# Patient Record
Sex: Female | Born: 1955 | Race: White | Hispanic: No | Marital: Married | Smoking: Never smoker
Health system: Southern US, Community
[De-identification: ages and names within clinical notes are randomized; demographics above are authoritative.]

## PROBLEM LIST (undated history)

## (undated) DIAGNOSIS — Z973 Presence of spectacles and contact lenses: Secondary | ICD-10-CM

## (undated) DIAGNOSIS — E079 Disorder of thyroid, unspecified: Secondary | ICD-10-CM

## (undated) DIAGNOSIS — M545 Low back pain, unspecified: Secondary | ICD-10-CM

## (undated) DIAGNOSIS — E559 Vitamin D deficiency, unspecified: Secondary | ICD-10-CM

## (undated) DIAGNOSIS — T753XXA Motion sickness, initial encounter: Secondary | ICD-10-CM

## (undated) DIAGNOSIS — IMO0002 Reserved for concepts with insufficient information to code with codable children: Secondary | ICD-10-CM

## (undated) DIAGNOSIS — E039 Hypothyroidism, unspecified: Secondary | ICD-10-CM

## (undated) DIAGNOSIS — Z972 Presence of dental prosthetic device (complete) (partial): Secondary | ICD-10-CM

## (undated) DIAGNOSIS — M1990 Unspecified osteoarthritis, unspecified site: Secondary | ICD-10-CM

## (undated) DIAGNOSIS — M5481 Occipital neuralgia: Secondary | ICD-10-CM

## (undated) HISTORY — DX: Low back pain: M54.5

## (undated) HISTORY — DX: Reserved for concepts with insufficient information to code with codable children: IMO0002

## (undated) HISTORY — DX: Low back pain, unspecified: M54.50

## (undated) HISTORY — PX: GALLBLADDER SURGERY: SHX652

## (undated) HISTORY — DX: Unspecified osteoarthritis, unspecified site: M19.90

## (undated) HISTORY — DX: Vitamin D deficiency, unspecified: E55.9

## (undated) HISTORY — DX: Disorder of thyroid, unspecified: E07.9

## (undated) HISTORY — PX: OTHER SURGICAL HISTORY: SHX169

---

## 2004-11-10 ENCOUNTER — Ambulatory Visit: Payer: Self-pay | Admitting: Orthopedic Surgery

## 2005-06-13 ENCOUNTER — Ambulatory Visit (HOSPITAL_COMMUNITY): Admission: RE | Admit: 2005-06-13 | Discharge: 2005-06-13 | Payer: Self-pay | Admitting: Obstetrics & Gynecology

## 2006-03-28 ENCOUNTER — Ambulatory Visit (HOSPITAL_COMMUNITY): Admission: RE | Admit: 2006-03-28 | Discharge: 2006-03-28 | Payer: Self-pay | Admitting: Pulmonary Disease

## 2006-05-14 ENCOUNTER — Ambulatory Visit: Payer: Self-pay | Admitting: Orthopedic Surgery

## 2006-06-25 ENCOUNTER — Ambulatory Visit (HOSPITAL_COMMUNITY): Admission: RE | Admit: 2006-06-25 | Discharge: 2006-06-25 | Payer: Self-pay | Admitting: Obstetrics & Gynecology

## 2006-07-04 ENCOUNTER — Ambulatory Visit: Payer: Self-pay | Admitting: Orthopedic Surgery

## 2006-08-03 ENCOUNTER — Ambulatory Visit: Payer: Self-pay | Admitting: Gastroenterology

## 2006-08-13 ENCOUNTER — Ambulatory Visit: Payer: Self-pay | Admitting: Internal Medicine

## 2006-08-13 ENCOUNTER — Ambulatory Visit (HOSPITAL_COMMUNITY): Admission: RE | Admit: 2006-08-13 | Discharge: 2006-08-13 | Payer: Self-pay | Admitting: Internal Medicine

## 2006-11-13 ENCOUNTER — Ambulatory Visit (HOSPITAL_COMMUNITY): Admission: RE | Admit: 2006-11-13 | Discharge: 2006-11-13 | Payer: Self-pay | Admitting: Pulmonary Disease

## 2007-06-17 ENCOUNTER — Other Ambulatory Visit: Admission: RE | Admit: 2007-06-17 | Discharge: 2007-06-17 | Payer: Self-pay | Admitting: Obstetrics & Gynecology

## 2007-08-05 ENCOUNTER — Ambulatory Visit (HOSPITAL_COMMUNITY): Admission: RE | Admit: 2007-08-05 | Discharge: 2007-08-05 | Payer: Self-pay | Admitting: Obstetrics & Gynecology

## 2008-01-20 ENCOUNTER — Ambulatory Visit (HOSPITAL_COMMUNITY): Admission: RE | Admit: 2008-01-20 | Discharge: 2008-01-20 | Payer: Self-pay | Admitting: Pulmonary Disease

## 2008-06-11 ENCOUNTER — Ambulatory Visit: Payer: Self-pay | Admitting: Orthopedic Surgery

## 2008-06-11 DIAGNOSIS — M25569 Pain in unspecified knee: Secondary | ICD-10-CM | POA: Insufficient documentation

## 2008-06-11 DIAGNOSIS — M1710 Unilateral primary osteoarthritis, unspecified knee: Secondary | ICD-10-CM

## 2008-06-11 DIAGNOSIS — S53106A Unspecified dislocation of unspecified ulnohumeral joint, initial encounter: Secondary | ICD-10-CM | POA: Insufficient documentation

## 2008-06-23 ENCOUNTER — Other Ambulatory Visit: Admission: RE | Admit: 2008-06-23 | Discharge: 2008-06-23 | Payer: Self-pay | Admitting: Obstetrics & Gynecology

## 2008-07-28 ENCOUNTER — Telehealth: Payer: Self-pay | Admitting: Orthopedic Surgery

## 2008-08-10 ENCOUNTER — Ambulatory Visit (HOSPITAL_COMMUNITY): Admission: RE | Admit: 2008-08-10 | Discharge: 2008-08-10 | Payer: Self-pay | Admitting: Obstetrics & Gynecology

## 2008-10-02 ENCOUNTER — Telehealth: Payer: Self-pay | Admitting: Orthopedic Surgery

## 2009-02-08 ENCOUNTER — Ambulatory Visit: Payer: Self-pay | Admitting: Orthopedic Surgery

## 2009-05-17 ENCOUNTER — Encounter (INDEPENDENT_AMBULATORY_CARE_PROVIDER_SITE_OTHER): Payer: Self-pay

## 2009-05-17 LAB — CONVERTED CEMR LAB
BUN: 13 mg/dL
Calcium: 9.1 mg/dL
Chloride: 105 meq/L
Glucose, Bld: 92 mg/dL
HDL: 51 mg/dL
LDL Cholesterol: 130 mg/dL
Triglycerides: 150 mg/dL

## 2009-06-09 ENCOUNTER — Ambulatory Visit (HOSPITAL_COMMUNITY): Admission: RE | Admit: 2009-06-09 | Discharge: 2009-06-09 | Payer: Self-pay | Admitting: Pulmonary Disease

## 2009-06-14 ENCOUNTER — Ambulatory Visit: Payer: Self-pay | Admitting: Cardiology

## 2009-06-22 ENCOUNTER — Encounter: Payer: Self-pay | Admitting: Adult Health

## 2009-06-22 ENCOUNTER — Encounter (INDEPENDENT_AMBULATORY_CARE_PROVIDER_SITE_OTHER): Payer: Self-pay

## 2009-06-22 ENCOUNTER — Ambulatory Visit: Payer: Self-pay | Admitting: Cardiology

## 2009-06-22 DIAGNOSIS — R0602 Shortness of breath: Secondary | ICD-10-CM | POA: Insufficient documentation

## 2009-06-22 DIAGNOSIS — R079 Chest pain, unspecified: Secondary | ICD-10-CM | POA: Insufficient documentation

## 2009-08-11 ENCOUNTER — Other Ambulatory Visit: Admission: RE | Admit: 2009-08-11 | Discharge: 2009-08-11 | Payer: Self-pay | Admitting: Obstetrics & Gynecology

## 2009-09-06 ENCOUNTER — Ambulatory Visit (HOSPITAL_COMMUNITY): Admission: RE | Admit: 2009-09-06 | Discharge: 2009-09-06 | Payer: Self-pay | Admitting: Obstetrics & Gynecology

## 2009-11-22 ENCOUNTER — Ambulatory Visit: Payer: Self-pay | Admitting: Orthopedic Surgery

## 2010-05-02 ENCOUNTER — Telehealth: Payer: Self-pay | Admitting: Orthopedic Surgery

## 2010-07-25 ENCOUNTER — Encounter: Payer: Self-pay | Admitting: Orthopedic Surgery

## 2010-08-02 ENCOUNTER — Telehealth: Payer: Self-pay | Admitting: Orthopedic Surgery

## 2010-08-31 ENCOUNTER — Encounter: Payer: Self-pay | Admitting: Orthopedic Surgery

## 2010-09-01 ENCOUNTER — Ambulatory Visit
Admission: RE | Admit: 2010-09-01 | Discharge: 2010-09-01 | Payer: Self-pay | Source: Home / Self Care | Attending: Orthopedic Surgery | Admitting: Orthopedic Surgery

## 2010-09-01 DIAGNOSIS — M25519 Pain in unspecified shoulder: Secondary | ICD-10-CM | POA: Insufficient documentation

## 2010-09-01 DIAGNOSIS — G5690 Unspecified mononeuropathy of unspecified upper limb: Secondary | ICD-10-CM | POA: Insufficient documentation

## 2010-09-04 ENCOUNTER — Encounter: Payer: Self-pay | Admitting: Pulmonary Disease

## 2010-09-07 ENCOUNTER — Encounter (HOSPITAL_COMMUNITY)
Admission: RE | Admit: 2010-09-07 | Discharge: 2010-09-13 | Payer: Self-pay | Source: Home / Self Care | Attending: Orthopedic Surgery | Admitting: Orthopedic Surgery

## 2010-09-08 ENCOUNTER — Encounter: Payer: Self-pay | Admitting: Orthopedic Surgery

## 2010-09-13 NOTE — Progress Notes (Signed)
Summary: call about 6 m appt reminder  Phone Note Call from Patient   Caller: Patient Summary of Call: Patient rec'd 6 m reminder for appointment due in Oct 2011, response to med. Asking if "really needs to come" due to work/school schedule, difficulty getting time off.  States nothing has changed.  Need to come for refill Rx? Please advise - ph above ([phone]) Initial call taken by: Cammie Sickle,  May 02, 2010 12:29 PM  Follow-up for Phone Call        ok to cancel appt   let us know if any changes  Follow-up by: Fuller Canada MD,  May 02, 2010 12:37 PM  Additional Follow-up for Phone Call Additional follow up Details #1::        called patient; left voice mail message. Additional Follow-up by: Cammie Sickle,  May 03, 2010 11:28 AM

## 2010-09-13 NOTE — Assessment & Plan Note (Signed)
Summary: FOR MED REFILL/ST BCBS/BSF   Visit Type:  Follow-up Primary Provider:  [name]  CC:  KNEE PAIN .  History of Present Illness: 55 year old female we are following for osteoarthritis of the knees.  She is taking diclofenac 50 mg b.i.d. and is doing very well.  She only has intermittent symptoms at this time.  She says she is 98% better.  Her activity level is good.  She's having no major symptoms from the 50 mg b.i.d. tablets and is also helping her disc problem in her cervical spine   06/11/08 last xrays bilateral knees, will gert new xrays today.  OA bilateral knees.  x-rays were taken today compared to previous films about 6 months ago there's been no major change in her x-rays  Recommend continue diclofenac 50 twice a day follow up 6 months no x-rays needed unless symptoms change      Allergies: No Known Drug Allergies   Impression & Recommendations:  Problem # 1:  KNEE, ARTHRITIS, DEGEN./OSTEO (ICD-715.96) Assessment Improved  Her updated medication list for this problem includes:    Diclofenac Sodium 50 Mg Tbec (Diclofenac sodium) ..... One by mouth bid  Orders: Est. Patient Level III (27253) Knee x-ray,  3 views (73562)bilateral 3 views  Mild joint space narrowing both knees no change vs. x-rays taken 6 months ago  Impression mild osteoarthritis  Medications Added to Medication List This Visit: 1)  Vitamin D 50,000  .... Take 1 time weekly  Patient Instructions: 1)  continue diclofenac 50 two times a day see  you in 6 months  Prescriptions: DICLOFENAC SODIUM 50 MG TBEC (DICLOFENAC SODIUM) one by mouth bid  #30 x 3   Entered and Authorized by:   Fuller Canada MD   Signed by:   Fuller Canada MD on 11/22/2009   Method used:   Faxed to ...       [facility]  [address] [facility] (retail)       [address]       [place], [place]  [address]       Ph: [phone]       Fax: [phone]   RxID:   [id number] DICLOFENAC  SODIUM 50 MG TBEC (DICLOFENAC SODIUM) one by mouth bid  #30 x 3   Entered and Authorized by:   Fuller Canada MD   Signed by:   Fuller Canada MD on 11/22/2009   Method used:   Historical   RxID:   [id number]

## 2010-09-14 ENCOUNTER — Other Ambulatory Visit: Payer: Self-pay | Admitting: Obstetrics & Gynecology

## 2010-09-14 ENCOUNTER — Ambulatory Visit (HOSPITAL_COMMUNITY)
Admission: RE | Admit: 2010-09-14 | Discharge: 2010-09-14 | Disposition: A | Discharge status: Home / Self Care | Payer: BC Managed Care – HMO | Source: Ambulatory Visit | Attending: Orthopedic Surgery | Admitting: Orthopedic Surgery

## 2010-09-14 DIAGNOSIS — Z139 Encounter for screening, unspecified: Secondary | ICD-10-CM

## 2010-09-14 DIAGNOSIS — IMO0001 Reserved for inherently not codable concepts without codable children: Secondary | ICD-10-CM | POA: Insufficient documentation

## 2010-09-14 DIAGNOSIS — M25619 Stiffness of unspecified shoulder, not elsewhere classified: Secondary | ICD-10-CM | POA: Insufficient documentation

## 2010-09-14 DIAGNOSIS — M25519 Pain in unspecified shoulder: Secondary | ICD-10-CM | POA: Insufficient documentation

## 2010-09-14 DIAGNOSIS — M6281 Muscle weakness (generalized): Secondary | ICD-10-CM | POA: Insufficient documentation

## 2010-09-15 NOTE — Letter (Signed)
Summary: History form  History form   Imported By: Jacklynn Ganong 09/06/2010 14:03:30  _____________________________________________________________________  External Attachment:    Type:   Image     Comment:   External Document

## 2010-09-15 NOTE — Letter (Signed)
Summary: Notes from [facility]  Notes from [facility]   Imported By: Jacklynn Ganong 09/09/2010 12:29:06  _____________________________________________________________________  External Attachment:    Type:   Image     Comment:   External Document

## 2010-09-15 NOTE — Assessment & Plan Note (Signed)
Summary: NEW PROB/LT SHOULDER PAIN/NEEDS XRAY/REC'D MRI CSPINE/OK'D PE...   Vital Signs:  Patient profile:   55 year old female Height:      70 inches Pulse rate:   80 / minute Resp:     16 per minute  Vitals Entered By: Fuller Canada MD (September 01, 2010 8:50 AM)  Visit Type:  new problem Referring Provider:  self Primary Provider:  [name]  CC:  left shoulder pain.  History of Present Illness: I saw Kelsey Contreras in the office today for a new problem visit.  She is a 55 years old woman with the complaint of:  left shoulder pain.  No injury.  Xrays today.  Meds: Levothyroxine, Diclofenac, Fiber, Vitamin D.  Note patient has been followed by a pain management specialist and also is followed by Dr. Jeral Fruit for her issues with her cervical spine   This patient presents with pain along the medial border of her scapula in the posterior glenohumeral joint line with no associated trauma.  Today she says she's not hurting him badly.  She also has cervical disc disease at C5-C6, C6-C7.  She is being followed at the pain clinic for occipital headaches.  She comes in for evaluation of the shoulder to see if these are related.  she basically complains of dull stabbing pain 3/10 primarily in the late afternoon and at bedtime though sometimes all day.  She does have some numbness and tingling in the arm which is most likely related to her cervical disc disease.  She has difficulty sleeping or lying on her LEFT side and she does note some weakness in the LEFT upper extremity.     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 Gastrointestinal:  Complains of constipation; denies heartburn, nausea, vomiting, diarrhea, and blood in your stools. Genitourinary:  Complains of frequency; denies urgency, difficulty urinating, painful urination, flank pain, and bleeding in urine. Neurologic:  Complains of numbness; denies tingling, unsteady gait, dizziness, tremors, and seizure. Musculoskeletal:  Complains of joint pain and stiffness; denies swelling, instability, redness, heat, and muscle pain. HEENT:  Complains of watering; denies blurred or double vision, eye pain, and redness. Immunology:  Complains of seasonal allergies; denies sinus problems and allergic to bee stings.  The review of systems is negative for Constitutional, Respiratory, Endocrine, Psychiatric, Skin, and Hemoatologic.  Physical Exam  Additional Exam:   General appearance is normal.  Orientation x3 normal mood and affect. Normal.  Gait and station are normal.  She demonstrates fairly good range of motion in the cervical spine.  LEFT medial border of the scapula is tender and painful especially at the superior angle. He demonstrates full range of motion. Shoulder is stable in abduction external rotation. Neer sign. Normal Hawkins sign. Normal bringing the arm across the chest causes no pain.  strength is 5 over 5 of the supraspinatus.  Skin is intact. Pulses are good. Temperatures normal. No palpable lymph nodes are present.  The coordination and sensation were normal  The reflexes were normal     Impression & Recommendations:  Problem # 1:  SHOULDER PAIN (ICD-719.41) Assessment New  Separate  and Identifiable X-Ray report      AP lateral LEFT shoulder/shoulder pain  Radiographs show normal glenohumeral joint.  Type II acromion.  No acromial spur.  Normal bony anatomy.  Impression normal shoulder.  Orders: Est. Patient  Level IV (16606) Shoulder x-ray,  minimum 2 views (30160)  Problem # 2:  UNSPECIFIED MONONEURITIS OF UPPER LIMB (ICD-354.9)  may have a trigger point on the medial border of the scapular may be some radicular pain from a thoracic upper disc but basically appears to be myofascial  Recommend physical therapy home program follow as needed  Orders: Est. Patient Level IV (10932)   Orders Added: 1)  Est. Patient Level IV [35573] 2)  Shoulder x-ray,  minimum 2 views [73030]

## 2010-09-15 NOTE — Letter (Signed)
Summary: Office note from Dr. Loraine Leriche Phillips/[facility]  Office note from Dr. Loraine Leriche Phillips/[facility]   Imported By: Jacklynn Ganong 07/25/2010 16:07:20  _____________________________________________________________________  External Attachment:    Type:   Image     Comment:   External Document

## 2010-09-15 NOTE — Progress Notes (Signed)
Summary: patient requested notes, film for review here  Phone Note Call from Patient   Caller: Patient Summary of Call: Patient called to re-check on status --  She is request'g appointment here for Problem: shoulder pain/neck pain.  An office note she had req'd to be faxed to our office from [facility], Dr Thyra Breed has been rec'd.  (Her ph is [phone]) She had also obtained copy of her MRI C-spine film from [facility] - it is here.  All placed in Dr's box for review Advised Dr Romeo Apple will look over all and we will notify her of his review and whether we would schedule here or other recommendation. Initial call taken by: Cammie Sickle,  August 02, 2010 11:24 AM  Follow-up for Phone Call        On 08/03/10 at 2:50pm I called patient per Dr Mort Sawyers written note - see for shoulder only (for neck/C-spine-it would be Dr. Jeral Fruit) -scheduled appointment for shoulder for 09/01/10. Note and MRI film here. Follow-up by: Cammie Sickle,  August 04, 2010 9:17 AM

## 2010-09-21 ENCOUNTER — Ambulatory Visit (HOSPITAL_COMMUNITY)
Admission: RE | Admit: 2010-09-21 | Discharge: 2010-09-21 | Disposition: A | Discharge status: Home / Self Care | Payer: BC Managed Care – HMO | Source: Ambulatory Visit | Attending: Orthopedic Surgery | Admitting: Orthopedic Surgery

## 2010-09-21 DIAGNOSIS — IMO0001 Reserved for inherently not codable concepts without codable children: Secondary | ICD-10-CM | POA: Insufficient documentation

## 2010-09-21 DIAGNOSIS — M25519 Pain in unspecified shoulder: Secondary | ICD-10-CM | POA: Insufficient documentation

## 2010-09-21 DIAGNOSIS — M6281 Muscle weakness (generalized): Secondary | ICD-10-CM | POA: Insufficient documentation

## 2010-09-28 ENCOUNTER — Ambulatory Visit (HOSPITAL_COMMUNITY)
Admission: RE | Admit: 2010-09-28 | Discharge: 2010-09-28 | Disposition: A | Discharge status: Home / Self Care | Payer: BC Managed Care – HMO | Source: Ambulatory Visit | Attending: Orthopedic Surgery | Admitting: Orthopedic Surgery

## 2010-09-28 DIAGNOSIS — M25519 Pain in unspecified shoulder: Secondary | ICD-10-CM | POA: Insufficient documentation

## 2010-09-28 DIAGNOSIS — IMO0001 Reserved for inherently not codable concepts without codable children: Secondary | ICD-10-CM | POA: Insufficient documentation

## 2010-09-28 DIAGNOSIS — M25619 Stiffness of unspecified shoulder, not elsewhere classified: Secondary | ICD-10-CM | POA: Insufficient documentation

## 2010-09-28 DIAGNOSIS — M6281 Muscle weakness (generalized): Secondary | ICD-10-CM | POA: Insufficient documentation

## 2010-09-30 ENCOUNTER — Ambulatory Visit (HOSPITAL_COMMUNITY)
Admission: RE | Admit: 2010-09-30 | Discharge: 2010-09-30 | Disposition: A | Discharge status: Home / Self Care | Payer: BC Managed Care – HMO | Source: Ambulatory Visit | Attending: Obstetrics & Gynecology | Admitting: Obstetrics & Gynecology

## 2010-09-30 DIAGNOSIS — Z1231 Encounter for screening mammogram for malignant neoplasm of breast: Secondary | ICD-10-CM | POA: Insufficient documentation

## 2010-09-30 DIAGNOSIS — Z139 Encounter for screening, unspecified: Secondary | ICD-10-CM

## 2010-10-03 ENCOUNTER — Encounter: Payer: Self-pay | Admitting: Orthopedic Surgery

## 2010-10-05 ENCOUNTER — Ambulatory Visit (HOSPITAL_COMMUNITY)
Admission: RE | Admit: 2010-10-05 | Discharge: 2010-10-05 | Disposition: A | Discharge status: Home / Self Care | Payer: BC Managed Care – HMO | Source: Ambulatory Visit | Attending: Orthopedic Surgery | Admitting: Orthopedic Surgery

## 2010-10-11 NOTE — Miscellaneous (Signed)
Summary: OT clinical evaluation  OT clinical evaluation   Imported By: Jacklynn Ganong 10/04/2010 14:59:21  _____________________________________________________________________  External Attachment:    Type:   Image     Comment:   External Document

## 2010-10-12 ENCOUNTER — Encounter: Payer: Self-pay | Admitting: Orthopedic Surgery

## 2010-10-25 NOTE — Miscellaneous (Signed)
Summary: OT Discharge summary  OT Discharge summary   Imported By: Jacklynn Ganong 10/19/2010 08:08:38  _____________________________________________________________________  External Attachment:    Type:   Image     Comment:   External Document

## 2010-12-30 NOTE — Consult Note (Signed)
NAME:  Kelsey Contreras, HOSKIE                  ACCOUNT NO.:  [id number]   MEDICAL RECORD NO.:  [id number]          PATIENT TYPE:  AMB   LOCATION:                                FACILITY:  [facility]   PHYSICIAN:  Lionel December, M.D.    DATE OF BIRTH:  1955-08-24   DATE OF CONSULTATION:  DATE OF DISCHARGE:                                 CONSULTATION   REASON FOR CONSULTATION:  Change in bowel movements, colonoscopy.   HISTORY OF PRESENT ILLNESS:  The patient is a 55 year old Caucasian  female who has a long history of episodes of explosive diarrhea. At  other times she feels constipated. She says for the last 3 years she has  noted bright red blood on the toilet tissue. This occurs with almost  every bowel movement. A year ago she had heme positive stools based on  the digital rectal examination. She never pursued any work up. Denies  nausea, vomiting, heart burn, unintentional weight loss.   CURRENT MEDICATIONS:  1. Progesterone daily.  2. Calcium daily.  3. Glucosamine Chondroitin daily.  4. Over-the-counter fiber 2 daily.  5. Ibuprofen 600 mg daily, usually 3 tablets a week.   ALLERGIES:  No known drug allergies.   PAST MEDICAL HISTORY:  1. Recently diagnosed with hypothyroidism. She is on medication for a      month but stopped it due to side effects.  2. Arthritis.  3. No prior colonoscopy.  4. She had a cholecystectomy 17 years ago.  5. Cesarean section x 2.  6. A laparoscopy for endometriosis.   FAMILY HISTORY:  Father died due to complications of hepatitis B at age  32. No family history of colorectal cancer.   SOCIAL HISTORY:  She is married and has two children. She is employed  with [employer]. Has never been a smoker. No alcohol use.   REVIEW OF SYSTEMS:  See history of present illness for GI.  CONSTITUTIONAL - no weight loss. CARDIOPULMONARY - no chest pain or  shortness of breath.   PHYSICAL EXAMINATION:  VITAL SIGNS: Weight 213. Height 5 feet, 10  inches.  Temperature 97.6, blood pressure 122/84, pulse 72.  GENERAL:  Pleasant, well nourished, well developed Caucasian female in  no acute distress.  SKIN: Warm and dry, no jaundice.  HEENT: Sclerae non icteric. Oropharyngeal mucosa moist and pink, no  lesions, erythema or exudate.  NECK: No lymphadenopathy or thyromegaly.  CHEST: Lungs are clear to auscultation.  CARDIAC EXAM: Reveals regular rate and rhythm, normal S1, S2, no  murmurs, rubs, or gallops.  ABDOMEN: Positive bowel sounds, soft, nontender, nondistended, no  organomegaly or masses. No rebound tenderness or guarding, no abdominal  bruits or hernias.  EXTREMITIES: No edema.   IMPRESSION:  Kelsey Contreras is a 55 year old lady with alternating  constipation and diarrhea which has been more significant recently. She  has had frequent small volume hematochezia as well. She has never had a  colonoscopy but needs one for diagnostic purposes. I discussed risk,  alternatives, benefits with regards to but not limited to the risk of  reaction to medication, perforation, incomplete exam, bleeding or  infection.   PLAN:  Colonoscopy with Dr. Karilyn Cota in the near future.    Dictated by Tana Coast, P.A.      Tana Coast, P.A.      Lionel December, M.D.  Electronically Signed    LL/MEDQ  D:  08/03/2006  T:  08/03/2006  Job:  [id number]

## 2010-12-30 NOTE — Op Note (Signed)
NAME:  Kelsey Contreras, Kelsey Contreras                  ACCOUNT NO.:  [id number]   MEDICAL RECORD NO.:  [id number]          PATIENT TYPE:  AMB   LOCATION:  DAY                           FACILITY:  [facility]   PHYSICIAN:  Lionel December, M.D.    DATE OF BIRTH:  [birth year]   DATE OF PROCEDURE:  08/13/2006  DATE OF DISCHARGE:                               OPERATIVE REPORT   INDICATIONS:  [name] is a 55 year old Caucasian female who has recently  noted change in her bowel habits.  She either has diarrhea and/or  constipation.  It is suspected that she may have IBS, but she needs to  undergo this exam to make sure she does not have colonic neoplasm.  Procedure risks were reviewed with the patient and informed consent was  obtained.   MEDICATIONS FOR CONSCIOUS SEDATION:  Demerol 50 mg IV and Versed 8 mg IV  in divided dose.   FINDINGS:  Procedure performed in endoscopy suite.  The patient's vital  signs and O2 sats were monitored during the procedure and remained  stable.  The patient was placed in left lateral recumbent position and  rectal examination performed.  No abnormality noted on external or  digital exam.  Pentax videoscope was placed in the rectum and advanced  under vision into sigmoid colon and beyond.  The scope was withdrawn  multiple times in order to straighten the loop and once this was  possible, scope was easily passed into cecum.  Cecum was identified by  appendiceal orifice and ileocecal valve.  Pictures taken for the record.  Preparation was satisfactory.  As the scope was withdrawn, colonic  mucosa was carefully examined.  There were no polyps or tumor masses or  colonic stricture noted.  There was a single small diverticulum at  sigmoid colon.  Rectal mucosa was normal.  Scope was retroflexed to  examine anorectal junction which was unremarkable.  Endoscope was  straightened and withdrawn.  The patient tolerated the procedure well.   FINAL DIAGNOSIS:  Single small diverticulum at sigmoid  colon, otherwise  normal colonoscopy.   Suspect her irregular bowel movements secondary to irritable bowel  syndrome.   RECOMMENDATIONS:  1. High-fiber diet.  2. Fiber supplement 3-4 g daily.   The patient will call us with a progress support in a few weeks.   She will not need another colonoscopy or a screening exam in 10 years  from now.     Lionel December, M.D.  Electronically Signed    NR/MEDQ  D:  08/13/2006  T:  08/13/2006  Job:  [id number]

## 2011-01-01 ENCOUNTER — Other Ambulatory Visit: Payer: Self-pay | Admitting: Orthopedic Surgery

## 2011-01-01 DIAGNOSIS — M1990 Unspecified osteoarthritis, unspecified site: Secondary | ICD-10-CM

## 2011-07-07 ENCOUNTER — Other Ambulatory Visit: Payer: Self-pay | Admitting: Orthopedic Surgery

## 2011-07-10 NOTE — Telephone Encounter (Signed)
?  OK TO REFILL °

## 2011-08-04 ENCOUNTER — Other Ambulatory Visit: Payer: Self-pay | Admitting: Obstetrics & Gynecology

## 2011-08-04 ENCOUNTER — Other Ambulatory Visit (HOSPITAL_COMMUNITY)
Admission: RE | Admit: 2011-08-04 | Discharge: 2011-08-04 | Disposition: A | Discharge status: Home / Self Care | Payer: BC Managed Care – PPO | Source: Ambulatory Visit | Attending: Obstetrics & Gynecology | Admitting: Obstetrics & Gynecology

## 2011-08-04 DIAGNOSIS — Z01419 Encounter for gynecological examination (general) (routine) without abnormal findings: Secondary | ICD-10-CM | POA: Insufficient documentation

## 2011-09-20 ENCOUNTER — Other Ambulatory Visit: Payer: Self-pay | Admitting: Obstetrics & Gynecology

## 2011-09-20 DIAGNOSIS — Z139 Encounter for screening, unspecified: Secondary | ICD-10-CM

## 2011-10-08 ENCOUNTER — Other Ambulatory Visit: Payer: Self-pay | Admitting: Orthopedic Surgery

## 2011-10-09 ENCOUNTER — Ambulatory Visit (HOSPITAL_COMMUNITY)
Admission: RE | Admit: 2011-10-09 | Discharge: 2011-10-09 | Disposition: A | Discharge status: Home / Self Care | Payer: BC Managed Care – PPO | Source: Ambulatory Visit | Attending: Obstetrics & Gynecology | Admitting: Obstetrics & Gynecology

## 2011-10-09 DIAGNOSIS — Z139 Encounter for screening, unspecified: Secondary | ICD-10-CM

## 2011-10-09 DIAGNOSIS — Z1231 Encounter for screening mammogram for malignant neoplasm of breast: Secondary | ICD-10-CM | POA: Insufficient documentation

## 2012-02-10 ENCOUNTER — Other Ambulatory Visit: Payer: Self-pay | Admitting: Orthopedic Surgery

## 2012-02-10 DIAGNOSIS — M2550 Pain in unspecified joint: Secondary | ICD-10-CM

## 2012-06-26 ENCOUNTER — Encounter: Payer: Self-pay | Admitting: Orthopedic Surgery

## 2012-06-26 ENCOUNTER — Ambulatory Visit (INDEPENDENT_AMBULATORY_CARE_PROVIDER_SITE_OTHER): Payer: BC Managed Care – PPO | Admitting: Orthopedic Surgery

## 2012-06-26 ENCOUNTER — Ambulatory Visit (INDEPENDENT_AMBULATORY_CARE_PROVIDER_SITE_OTHER): Payer: BC Managed Care – PPO

## 2012-06-26 VITALS — Ht 69.0 in | Wt 216.0 lb

## 2012-06-26 DIAGNOSIS — M7550 Bursitis of unspecified shoulder: Secondary | ICD-10-CM

## 2012-06-26 DIAGNOSIS — M25519 Pain in unspecified shoulder: Secondary | ICD-10-CM

## 2012-06-26 DIAGNOSIS — M67919 Unspecified disorder of synovium and tendon, unspecified shoulder: Secondary | ICD-10-CM

## 2012-06-26 DIAGNOSIS — M19019 Primary osteoarthritis, unspecified shoulder: Secondary | ICD-10-CM

## 2012-06-26 NOTE — Progress Notes (Signed)
Patient ID: Kelsey Contreras, female   DOB: [birth year], 56 y.o.   MRN: [id number] Chief Complaint  Patient presents with  . Shoulder Pain    Right shoulder pain, no injury.   Past Medical History  Diagnosis Date  . Arthritis   . Bulging disc     in upper neck/ back  . Lower back pain   . Thyroid dysfunction     The patient has known cervical disc disease, and was treated for LEFT shoulder pain with physical therapy in the past 2 years.  Denies neck pain at this time denies numbness or tingling in the RIGHT upper extremity.  Pain in the RIGHT shoulder does radiate into the RIGHT arm. No problems raising the arm. She does have night pain and pain with internal rotation. Denies any weakness  Vital signs are stable as recorded  General appearance is normal  The patient is alert and oriented x3  The patient's mood and affect are normal  Gait assessment: normal The cardiovascular exam reveals normal pulses and temperature without edema swelling.  The lymphatic system is negative for palpable lymph nodes  The sensory exam is normal.  There are no pathologic reflexes.  Balance is normal.   Exam of the right shoulder  Inspection AC joint Tenderness.  No pain or tenderness around the anterolateral acromion for soft spot beneath the posterior acromion. Range of motion remains normal. Stability test were normal. Apprehension test negative. Rotator cuff strength is normal. She had pain with the cross chest abduction test.  Impression a.c. Joint arthrosis, mild bursitis.  Recommend a.c. Joint injection.  20605  Procedure note.  The patient gave consent for injection, RIGHT shoulder.  RIGHT shoulder joint. A.c. Joint injection.  Injection medication milligrams of Depo-Medrol.  2 cc 1% lidocaine.  As the chloride was used for skin refrigerant and anesthesia.  Alcohol was used to clean the area.  The injection was made in the a.c. Joint without palpitation.  Sterile  bandage was applied

## 2012-06-26 NOTE — Patient Instructions (Addendum)
Impingement Syndrome, Rotator Cuff, Bursitis with Rehab Impingement syndrome is a condition that involves inflammation of the tendons of the rotator cuff and the subacromial bursa, that causes pain in the shoulder. The rotator cuff consists of four tendons and muscles that control much of the shoulder and upper arm function. The subacromial bursa is a fluid filled sac that helps reduce friction between the rotator cuff and one of the bones of the shoulder (acromion). Impingement syndrome is usually an overuse injury that causes swelling of the bursa (bursitis), swelling of the tendon (tendonitis), and/or a tear of the tendon (strain). Strains are classified into three categories. Grade 1 strains cause pain, but the tendon is not lengthened. Grade 2 strains include a lengthened ligament, due to the ligament being stretched or partially ruptured. With grade 2 strains there is still function, although the function may be decreased. Grade 3 strains include a complete tear of the tendon or muscle, and function is usually impaired. SYMPTOMS    Pain around the shoulder, often at the outer portion of the upper arm.   Pain that gets worse with shoulder function, especially when reaching overhead or lifting.   Sometimes, aching when not using the arm.   Pain that wakes you up at night.   Sometimes, tenderness, swelling, warmth, or redness over the affected area.   Loss of strength.   Limited motion of the shoulder, especially reaching behind the back (to the back pocket or to unhook bra) or across your body.   Crackling sound (crepitation) when moving the arm.   Biceps tendon pain and inflammation (in the front of the shoulder). Worse when bending the elbow or lifting.  CAUSES   Impingement syndrome is often an overuse injury, in which chronic (repetitive) motions cause the tendons or bursa to become inflamed. A strain occurs when a force is paced on the tendon or muscle that is greater than it can  withstand. Common mechanisms of injury include: Stress from sudden increase in duration, frequency, or intensity of training.  Direct hit (trauma) to the shoulder.   Aging, erosion of the tendon with normal use.   Bony bump on shoulder (acromial spur).  RISK INCREASES WITH:  Contact sports (football, wrestling, boxing).   Throwing sports (baseball, tennis, volleyball).   Weightlifting and bodybuilding.   Heavy labor.   Previous injury to the rotator cuff, including impingement.   Poor shoulder strength and flexibility.   Failure to warm up properly before activity.   Inadequate protective equipment.   Old age.   Bony bump on shoulder (acromial spur).  PREVENTION    Warm up and stretch properly before activity.   Allow for adequate recovery between workouts.   Maintain physical fitness:   Strength, flexibility, and endurance.   Cardiovascular fitness.   Learn and use proper exercise technique.  PROGNOSIS   If treated properly, impingement syndrome usually goes away within 6 weeks. Sometimes surgery is required.   RELATED COMPLICATIONS    Longer healing time if not properly treated, or if not given enough time to heal.   Recurring symptoms, that result in a chronic condition.   Shoulder stiffness, frozen shoulder, or loss of motion.   Rotator cuff tendon tear.   Recurring symptoms, especially if activity is resumed too soon, with overuse, with a direct blow, or when using poor technique.  TREATMENT   Treatment first involves the use of ice and medicine, to reduce pain and inflammation. The use of strengthening and stretching exercises may help  MEDICATION  If pain medicine is needed, nonsteroidal anti-inflammatory medicines (aspirin and  ibuprofen), or other minor pain relievers (acetaminophen), are often advised.  Do not take pain medicine for 7 days before surgery.  Prescription pain relievers may be given, if your caregiver thinks they are needed. Use only as directed and only as much as you need.  Corticosteroid injections may be given by your caregiver. These injections should be reserved for the most serious cases, because they may only be given a certain number of times. HEAT AND COLD  Cold treatment (icing) should be applied for 10 to 15 minutes every 2 to 3 hours for inflammation and pain, and immediately after activity that aggravates your symptoms. Use ice packs or an ice massage.  Heat treatment may be used before performing stretching and strengthening activities prescribed by your caregiver, physical therapist, or athletic trainer. Use a heat pack or a warm water soak. SEEK MEDICAL CARE IF:   Symptoms get worse or do not improve in 4 to 6 weeks, despite treatment.  New, unexplained symptoms develop. (Drugs used in treatment may produce side effects.) EXERCISES  RANGE OF MOTION (ROM) AND STRETCHING EXERCISES - Impingement Syndrome (Rotator Cuff  Tendinitis, Bursitis) These exercises may help you when beginning to rehabilitate your injury. Your symptoms may go away with or without further involvement from your physician, physical therapist or athletic trainer. While completing these exercises, remember:   Restoring tissue flexibility helps normal motion to return to the joints. This allows healthier, less painful movement and activity.  An effective stretch should be held for at least 30 seconds.  A stretch should never be painful. You should only feel a gentle lengthening or release in the stretched tissue. STRETCH  Flexion, Standing  Stand with good posture. With an underhand grip on your right / left hand, and an overhand grip on the opposite hand, grasp a broomstick or cane so that your hands are a little  more than shoulder width apart.  Keeping your right / left elbow straight and shoulder muscles relaxed, push the stick with your opposite hand, to raise your right / left arm in front of your body and then overhead. Raise your arm until you feel a stretch in your right / left shoulder, but before you have increased shoulder pain.  Try to avoid shrugging your right / left shoulder as your arm rises, by keeping your shoulder blade tucked down and toward your mid-back spine. Hold for __________ seconds.  Slowly return to the starting position. Repeat __________ times. Complete this exercise __________ times per day. STRETCH  Abduction, Supine  Lie on your back. With an underhand grip on your right / left hand and an overhand grip on the opposite hand, grasp a broomstick or cane so that your hands are a little more than shoulder width apart.  Keeping your right / left elbow straight and your shoulder muscles relaxed, push the stick with your opposite hand, to raise your right / left arm out to the side of your body and then overhead. Raise your arm until you feel a stretch in your right / left shoulder, but before you have increased shoulder pain.  Try to avoid shrugging your right / left shoulder as your arm rises, by keeping your shoulder blade tucked down and toward your mid-back spine. Hold for __________ seconds.  Slowly return to the starting position. Repeat __________ times. Complete this exercise __________ times per day. ROM  Flexion, Active-Assisted  Lie on your back.   You may bend your knees for comfort.  Grasp a broomstick or cane so your hands are about shoulder width apart. Your right / left hand should grip the end of the stick, so that your hand is positioned "thumbs-up," as if you were about to shake hands.  Using your healthy arm to lead, raise your right / left arm overhead, until you feel a gentle stretch in your shoulder. Hold for __________ seconds.  Use the stick to  assist in returning your right / left arm to its starting position. Repeat __________ times. Complete this exercise __________ times per day.  ROM - Internal Rotation, Supine   Lie on your back on a firm surface. Place your right / left elbow about 60 degrees away from your side. Elevate your elbow with a folded towel, so that the elbow and shoulder are the same height.  Using a broomstick or cane and your strong arm, pull your right / left hand toward your body until you feel a gentle stretch, but no increase in your shoulder pain. Keep your shoulder and elbow in place throughout the exercise.  Hold for __________ seconds. Slowly return to the starting position. Repeat __________ times. Complete this exercise __________ times per day. STRETCH - Internal Rotation  Place your right / left hand behind your back, palm up.  Throw a towel or belt over your opposite shoulder. Grasp the towel with your right / left hand.  While keeping an upright posture, gently pull up on the towel, until you feel a stretch in the front of your right / left shoulder.  Avoid shrugging your right / left shoulder as your arm rises, by keeping your shoulder blade tucked down and toward your mid-back spine.  Hold for __________ seconds. Release the stretch, by lowering your healthy hand. Repeat __________ times. Complete this exercise __________ times per day. ROM - Internal Rotation   Using an underhand grip, grasp a stick behind your back with both hands.  While standing upright with good posture, slide the stick up your back until you feel a mild stretch in the front of your shoulder.  Hold for __________ seconds. Slowly return to your starting position. Repeat __________ times. Complete this exercise __________ times per day.  STRETCH  Posterior Shoulder Capsule   Stand or sit with good posture. Grasp your right / left elbow and draw it across your chest, keeping it at the same height as your  shoulder.  Pull your elbow, so your upper arm comes in closer to your chest. Pull until you feel a gentle stretch in the back of your shoulder.  Hold for __________ seconds. Repeat __________ times. Complete this exercise __________ times per day. STRENGTHENING EXERCISES - Impingement Syndrome (Rotator Cuff Tendinitis, Bursitis) These exercises may help you when beginning to rehabilitate your injury. They may resolve your symptoms with or without further involvement from your physician, physical therapist or athletic trainer. While completing these exercises, remember:  Muscles can gain both the endurance and the strength needed for everyday activities through controlled exercises.  Complete these exercises as instructed by your physician, physical therapist or athletic trainer. Increase the resistance and repetitions only as guided.  You may experience muscle soreness or fatigue, but the pain or discomfort you are trying to eliminate should never worsen during these exercises. If this pain does get worse, stop and make sure you are following the directions exactly. If the pain is still present after adjustments, discontinue the exercise until you can discuss   needed for everyday activities through controlled exercises.   Complete these exercises as instructed by your physician, physical therapist or athletic trainer. Increase the resistance and repetitions only as guided.   You may experience muscle soreness or fatigue, but the pain or discomfort you are trying to eliminate should never worsen during these exercises. If this pain does get worse, stop and make sure you are following the directions exactly. If the pain is still present after adjustments, discontinue the exercise until you can discuss the trouble with your clinician.   During your recovery, avoid activity or exercises which involve actions that place your injured hand or elbow above your head or behind your back or head. These positions stress the tissues which you are trying to heal.  STRENGTH - Scapular Depression and Adduction   With good posture, sit on a firm chair. Support your arms in front of you, with pillows, arm rests, or on a table top. Have your elbows in line with the sides of your body.   Gently draw your shoulder blades down and toward your mid-back spine. Gradually increase the tension, without tensing the muscles along the top of your shoulders and the back of your neck.   Hold for  __________ seconds. Slowly release the tension and relax your muscles completely before starting the next repetition.   After you have practiced this exercise, remove the arm support and complete the exercise in standing as well as sitting position.  Repeat __________ times. Complete this exercise __________ times per day.   STRENGTH - Shoulder Abductors, Isometric  With good posture, stand or sit about 4-6 inches from a wall, with your right / left side facing the wall.   Bend your right / left elbow. Gently press your right / left elbow into the wall. Increase the pressure gradually, until you are pressing as hard as you can, without shrugging your shoulder or increasing any shoulder discomfort.   Hold for __________ seconds.   Release the tension slowly. Relax your shoulder muscles completely before you begin the next repetition.  Repeat __________ times. Complete this exercise __________ times per day.   STRENGTH - External Rotators, Isometric  Keep your right / left elbow at your side and bend it 90 degrees.   Step into a door frame so that the outside of your right / left wrist can press against the door frame without your upper arm leaving your side.   Gently press your right / left wrist into the door frame, as if you were trying to swing the back of your hand away from your stomach. Gradually increase the tension, until you are pressing as hard as you can, without shrugging your shoulder or increasing any shoulder discomfort.   Hold for __________ seconds.   Release the tension slowly. Relax your shoulder muscles completely before you begin the next repetition.  Repeat __________ times. Complete this exercise __________ times per day.   STRENGTH - Supraspinatus   Stand or sit with good posture. Grasp a __________ weight, or an exercise band or tubing, so that your hand is "thumbs-up," like you are shaking hands.   Slowly lift your right / left arm in a "V" away from your thigh,  diagonally into the space between your side and straight ahead. Lift your hand to shoulder height or as far as you can, without increasing any shoulder pain. At first, many people do not lift their hands above shoulder height.   Avoid shrugging your right / left  your right / left elbow 90 degrees. Place a folded towel or small pillow under your right / left arm, so that your elbow is a few inches away from your side.  Keeping the tension on the exercise band, pull it away from your body, as if pivoting on your elbow. Be sure to keep your body steady, so that the movement is coming only from your rotating shoulder.  Hold for __________ seconds. Release the tension in a controlled manner, as you return to the starting position. Repeat __________ times. Complete this exercise __________ times per day.  STRENGTH - Internal Rotators   Secure a rubber exercise band or tubing to a fixed object (table, pole) so that it is at the same height as your right / left elbow when you are standing or sitting on a firm surface.  Stand or sit so that the secured exercise band is at your right / left side.  Bend your elbow 90 degrees. Place a folded towel or small pillow under your right / left arm so that your elbow is a few inches away from your side.  Keeping the tension on the exercise band, pull it across your body, toward your stomach. Be sure to keep your body steady, so that the movement is coming only from  your rotating shoulder.  Hold for __________ seconds. Release the tension in a controlled manner, as you return to the starting position. Repeat __________ times. Complete this exercise __________ times per day.  STRENGTH - Scapular Protractors, Standing   Stand arms length away from a wall. Place your hands on the wall, keeping your elbows straight.  Begin by dropping your shoulder blades down and toward your mid-back spine.  To strengthen your protractors, keep your shoulder blades down, but slide them forward on your rib cage. It will feel as if you are lifting the back of your rib cage away from the wall. This is a subtle motion and can be challenging to complete. Ask your caregiver for further instruction, if you are not sure you are doing the exercise correctly.  Hold for __________ seconds. Slowly return to the starting position, resting the muscles completely before starting the next repetition. Repeat __________ times. Complete this exercise __________ times per day. STRENGTH - Scapular Protractors, Supine  Lie on your back on a firm surface. Extend your right / left arm straight into the air while holding a __________ weight in your hand.  Keeping your head and back in place, lift your shoulder off the floor.  Hold for __________ seconds. Slowly return to the starting position, and allow your muscles to relax completely before starting the next repetition. Repeat __________ times. Complete this exercise __________ times per day. STRENGTH - Scapular Protractors, Quadruped  Get onto your hands and knees, with your shoulders directly over your hands (or as close as you can be, comfortably).  Keeping your elbows locked, lift the back of your rib cage up into your shoulder blades, so your mid-back rounds out. Keep your neck muscles relaxed.  Hold this position for __________ seconds. Slowly return to the starting position and allow your muscles to relax completely before starting the  next repetition. Repeat __________ times. Complete this exercise __________ times per day.  STRENGTH - Scapular Retractors  Secure a rubber exercise band or tubing to a fixed object (table, pole), so that it is at the height of your shoulders when you are either standing, or sitting on a firm armless chair.  With a   palm down grip, grasp an end of the band in each hand. Straighten your elbows and lift your hands straight in front of you, at shoulder height. Step back, away from the secured end of the band, until it becomes tense.  Squeezing your shoulder blades together, draw your elbows back toward your sides, as you bend them. Keep your upper arms lifted away from your body throughout the exercise.  Hold for __________ seconds. Slowly ease the tension on the band, as you reverse the directions and return to the starting position. Repeat __________ times. Complete this exercise __________ times per day. STRENGTH - Shoulder Extensors   Secure a rubber exercise band or tubing to a fixed object (table, pole) so that it is at the height of your shoulders when you are either standing, or sitting on a firm armless chair.  With a thumbs-up grip, grasp an end of the band in each hand. Straighten your elbows and lift your hands straight in front of you, at shoulder height. Step back, away from the secured end of the band, until it becomes tense.  Squeezing your shoulder blades together, pull your hands down to the sides of your thighs. Do not allow your hands to go behind you.  Hold for __________ seconds. Slowly ease the tension on the band, as you reverse the directions and return to the starting position. Repeat __________ times. Complete this exercise __________ times per day.  STRENGTH - Scapular Retractors and External Rotators   Secure a rubber exercise band or tubing to a fixed object (table, pole) so that it is at the height as your shoulders, when you are either standing, or sitting on a  firm armless chair.  With a palm down grip, grasp an end of the band in each hand. Bend your elbows 90 degrees and lift your elbows to shoulder height, at your sides. Step back, away from the secured end of the band, until it becomes tense.  Squeezing your shoulder blades together, rotate your shoulders so that your upper arms and elbows remain stationary, but your fists travel upward to head height.  Hold for __________ seconds. Slowly ease the tension on the band, as you reverse the directions and return to the starting position. Repeat __________ times. Complete this exercise __________ times per day.  STRENGTH - Scapular Retractors and External Rotators, Rowing   Secure a rubber exercise band or tubing to a fixed object (table, pole) so that it is at the height of your shoulders, when you are either standing, or sitting on a firm armless chair.  With a palm down grip, grasp an end of the band in each hand. Straighten your elbows and lift your hands straight in front of you, at shoulder height. Step back, away from the secured end of the band, until it becomes tense.  Step 1: Squeeze your shoulder blades together. Bending your elbows, draw your hands to your chest, as if you are rowing a boat. At the end of this motion, your hands and elbow should be at shoulder height and your elbows should be out to your sides.  Step 2: Rotate your shoulders, to raise your hands above your head. Your forearms should be vertical and your upper arms should be horizontal.  Hold for __________ seconds. Slowly ease the tension on the band, as you reverse the directions and return to the starting position. Repeat __________ times. Complete this exercise __________ times per day.  STRENGTH  Scapular Depressors  Find a sturdy chair   each hand. Straighten your elbows and lift your hands straight in front of you, at shoulder height. Step back, away from the secured end of the band, until it becomes tense.   Step 1: Squeeze your shoulder blades together. Bending your elbows, draw your hands to your chest, as if you are rowing a boat. At the end of this motion, your hands and elbow should be at shoulder height and your elbows should be out to your sides.   Step 2: Rotate your shoulders, to raise your hands above your head. Your forearms should be vertical and your upper arms should be horizontal.   Hold for __________ seconds. Slowly ease the tension on the band, as you reverse the directions and return to the starting position.  Repeat __________ times. Complete this exercise __________ times per day.   STRENGTH  Scapular Depressors  Find a sturdy chair without wheels, such as a dining room chair.    Keeping your feet on the floor, and your hands on the chair arms, lift your bottom up from the seat, and lock your elbows.   Keeping your elbows straight, allow gravity to pull your body weight down. Your shoulders will rise toward your ears.   Raise your body against gravity by drawing your shoulder blades down your back, shortening the distance between your shoulders and ears. Although your feet should always maintain contact with the floor, your feet should progressively support less body weight, as you get stronger.   Hold for __________ seconds. In a controlled and slow manner, lower your body weight to begin the next repetition.  Repeat __________ times. Complete this exercise __________ times per day.   Document Released: 07/31/2005 Document Revised: 10/23/2011 Document Reviewed: 11/12/2008 [facility] Patient Information 2013 [place], [place].   You have received a steroid shot. 15% of patients experience increased pain at the injection site with in the next 24 hours. This is best treated with ice and tylenol extra strength 2 tabs every 8 hours. If you are still having pain please call the office.

## 2012-09-03 ENCOUNTER — Telehealth: Payer: Self-pay | Admitting: *Deleted

## 2012-09-03 NOTE — Telephone Encounter (Signed)
Patient requesting refill on Voltaren 50mg  EC #[id number]. Pharmacy [facility] [place]

## 2012-09-05 ENCOUNTER — Other Ambulatory Visit: Payer: Self-pay | Admitting: *Deleted

## 2012-09-05 DIAGNOSIS — M2550 Pain in unspecified joint: Secondary | ICD-10-CM

## 2012-09-05 MED ORDER — DICLOFENAC SODIUM 50 MG PO TBEC: 50.0000 mg | DELAYED_RELEASE_TABLET | Freq: Two times a day (BID) | ORAL | Status: DC

## 2012-11-08 ENCOUNTER — Other Ambulatory Visit: Payer: Self-pay | Admitting: Obstetrics & Gynecology

## 2012-11-08 DIAGNOSIS — Z139 Encounter for screening, unspecified: Secondary | ICD-10-CM

## 2012-11-28 ENCOUNTER — Ambulatory Visit (HOSPITAL_COMMUNITY)
Admission: RE | Admit: 2012-11-28 | Discharge: 2012-11-28 | Disposition: A | Discharge status: Home / Self Care | Payer: BC Managed Care – PPO | Source: Ambulatory Visit | Attending: Obstetrics & Gynecology | Admitting: Obstetrics & Gynecology

## 2012-11-28 DIAGNOSIS — Z139 Encounter for screening, unspecified: Secondary | ICD-10-CM

## 2012-11-28 DIAGNOSIS — Z1231 Encounter for screening mammogram for malignant neoplasm of breast: Secondary | ICD-10-CM | POA: Insufficient documentation

## 2013-03-06 ENCOUNTER — Other Ambulatory Visit: Payer: Self-pay | Admitting: *Deleted

## 2013-03-06 DIAGNOSIS — M2550 Pain in unspecified joint: Secondary | ICD-10-CM

## 2013-03-06 MED ORDER — DICLOFENAC SODIUM 50 MG PO TBEC: 50.0000 mg | DELAYED_RELEASE_TABLET | Freq: Two times a day (BID) | ORAL | Status: DC

## 2013-05-26 ENCOUNTER — Encounter: Payer: Self-pay | Admitting: Women's Health

## 2013-05-26 ENCOUNTER — Encounter (INDEPENDENT_AMBULATORY_CARE_PROVIDER_SITE_OTHER): Payer: Self-pay

## 2013-05-26 ENCOUNTER — Ambulatory Visit (INDEPENDENT_AMBULATORY_CARE_PROVIDER_SITE_OTHER): Payer: BC Managed Care – PPO | Admitting: Women's Health

## 2013-05-26 VITALS — BP 108/70 | Ht 69.0 in

## 2013-05-26 DIAGNOSIS — L02239 Carbuncle of trunk, unspecified: Secondary | ICD-10-CM

## 2013-05-26 DIAGNOSIS — N611 Abscess of the breast and nipple: Secondary | ICD-10-CM

## 2013-05-26 MED ORDER — SULFAMETHOXAZOLE-TMP DS 800-160 MG PO TABS: 1.0000 | ORAL_TABLET | Freq: Two times a day (BID) | ORAL | Status: AC

## 2013-05-26 NOTE — Progress Notes (Signed)
Patient ID: Kelsey Contreras, female   DOB: 09-29-1955, 57 y.o.   MRN: [id number] Kelsey Contreras is a 57 y.o. female who is here today w/ report of finding bump on Rt breast when in shower on Friday. States not painful, no drainage, no nipple d/c or other changes in breast. Reports h/o abnormal mammogram w/ normal u/s and was told she had normal fibrocystic changes.  Next mammogram in Dec or Jan.    O: BP 108/70  Ht 5\' 9"  (1.753 m)       ~1cm red raised bump w/ what appears to be a white head at app 7 o'clock at base of Rt breast.  Non-tender to   touch or when gently squeezed to see if head would open, which it wouldn't.  Small patch of erythema to Rt of   bump.  Not hot to touch.  Feels to be contained to dermis, can 'pick up' bump separate from breast tissue. No   dominate palpable mass in remainder of breast, retraction or nipple discharge. Co-exam w/ JAG  A: Cyst/possible boil on Rt breast  P: Rx Septra DS BID x 10d     F/U 10d for recheck, if not improving will send for diagnostic mammogram  Cheral Marker, CNM, WHNP-BC 05/26/2013 3:13 PM

## 2013-06-09 ENCOUNTER — Ambulatory Visit (INDEPENDENT_AMBULATORY_CARE_PROVIDER_SITE_OTHER): Payer: BC Managed Care – PPO | Admitting: Women's Health

## 2013-06-09 ENCOUNTER — Encounter: Payer: Self-pay | Admitting: Women's Health

## 2013-06-09 VITALS — BP 120/78 | Ht 69.0 in

## 2013-06-09 DIAGNOSIS — L0292 Furuncle, unspecified: Secondary | ICD-10-CM

## 2013-06-09 DIAGNOSIS — N644 Mastodynia: Secondary | ICD-10-CM

## 2013-06-09 NOTE — Progress Notes (Signed)
Patient ID: Kelsey Contreras, female   DOB: 12-21-1955, 57 y.o.   MRN: [id number] Kelsey Contreras is a 57 y.o. Caucasian female here for f/u Rt breast cyst/boil. On 10/13 she was seen for this and was placed on septra ds bid x 10d, which she has completed. She states it came to a head and she was able to express some yellow purulent d/c from it. She feels like it is getting better, but she has noted some intermittent generalized Rt breast pain. 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   O: BP 120/78  Ht 5\' 9"  (1.753 m)      ~1cm red raised non-tender bump.  Small patch of erythema that was previously present is now gone. Not hot to       touch, non-indurated. Small resolving bruise Rt upper breast.  A: Probable resolving boil Rt breast     Intermittent Rt mastalgia  P: Discussed w/ JAG, d/t mastalgia would recommend diagnostic mammogram/Rt breast u/s     Pt was reluctant in having mammogram now, states she would rather wait until dec/jan when it is due     Spoke w/ [facility] who states they do not do diagnostic mammograms for intermittent mastalgia, and her next   mammogram is actually not due until April 2015.      Discussed this w/ pt, encouraged her to notify us if boil does not continue to improve/go away or any other changes              in breast.      F/U in Dec for pap/physical  Cheral Marker, CNM, [facility] 06/09/2013 12:30 PM

## 2013-08-18 ENCOUNTER — Other Ambulatory Visit: Payer: Self-pay | Admitting: *Deleted

## 2013-08-18 DIAGNOSIS — M2550 Pain in unspecified joint: Secondary | ICD-10-CM

## 2013-08-18 MED ORDER — DICLOFENAC SODIUM 50 MG PO TBEC: 50.0000 mg | DELAYED_RELEASE_TABLET | Freq: Two times a day (BID) | ORAL | Status: DC

## 2013-11-28 ENCOUNTER — Other Ambulatory Visit: Payer: Self-pay | Admitting: Obstetrics & Gynecology

## 2013-11-28 DIAGNOSIS — Z139 Encounter for screening, unspecified: Secondary | ICD-10-CM

## 2013-12-22 ENCOUNTER — Ambulatory Visit (HOSPITAL_COMMUNITY)
Admission: RE | Admit: 2013-12-22 | Discharge: 2013-12-22 | Disposition: A | Discharge status: Home / Self Care | Payer: BC Managed Care – PPO | Source: Ambulatory Visit | Attending: Obstetrics & Gynecology | Admitting: Obstetrics & Gynecology

## 2013-12-22 DIAGNOSIS — Z1231 Encounter for screening mammogram for malignant neoplasm of breast: Secondary | ICD-10-CM | POA: Insufficient documentation

## 2013-12-22 DIAGNOSIS — Z139 Encounter for screening, unspecified: Secondary | ICD-10-CM

## 2014-09-18 ENCOUNTER — Telehealth: Payer: Self-pay | Admitting: Orthopedic Surgery

## 2014-09-22 ENCOUNTER — Other Ambulatory Visit: Payer: Self-pay | Admitting: *Deleted

## 2014-09-22 DIAGNOSIS — M2550 Pain in unspecified joint: Secondary | ICD-10-CM

## 2014-09-22 MED ORDER — DICLOFENAC SODIUM 50 MG PO TBEC: 50.0000 mg | DELAYED_RELEASE_TABLET | Freq: Two times a day (BID) | ORAL | Status: DC

## 2014-09-22 NOTE — Telephone Encounter (Signed)
Patient is calling needing a refill on her diclofenac (VOLTAREN) 50 MG EC tablet [[id number]][[id number]] please advise?

## 2014-09-22 NOTE — Telephone Encounter (Signed)
Okay to refill? 

## 2014-09-22 NOTE — Telephone Encounter (Signed)
refill 

## 2014-09-22 NOTE — Telephone Encounter (Signed)
done

## 2014-11-24 ENCOUNTER — Other Ambulatory Visit: Payer: Self-pay | Admitting: Obstetrics & Gynecology

## 2014-11-24 DIAGNOSIS — Z1231 Encounter for screening mammogram for malignant neoplasm of breast: Secondary | ICD-10-CM

## 2015-01-01 ENCOUNTER — Ambulatory Visit (HOSPITAL_COMMUNITY)
Admission: RE | Admit: 2015-01-01 | Discharge: 2015-01-01 | Disposition: A | Discharge status: Home / Self Care | Payer: BC Managed Care – PPO | Source: Ambulatory Visit | Attending: Obstetrics & Gynecology | Admitting: Obstetrics & Gynecology

## 2015-01-01 DIAGNOSIS — Z1231 Encounter for screening mammogram for malignant neoplasm of breast: Secondary | ICD-10-CM | POA: Insufficient documentation

## 2015-01-25 ENCOUNTER — Other Ambulatory Visit: Payer: Self-pay | Admitting: *Deleted

## 2015-01-25 ENCOUNTER — Telehealth: Payer: Self-pay | Admitting: Orthopedic Surgery

## 2015-01-25 DIAGNOSIS — M2550 Pain in unspecified joint: Secondary | ICD-10-CM

## 2015-01-25 MED ORDER — DICLOFENAC SODIUM 50 MG PO TBEC: 50.0000 mg | DELAYED_RELEASE_TABLET | Freq: Two times a day (BID) | ORAL | Status: DC

## 2015-01-25 NOTE — Telephone Encounter (Signed)
Ok to refill 

## 2015-01-25 NOTE — Telephone Encounter (Signed)
refill 

## 2015-01-25 NOTE — Telephone Encounter (Signed)
Patient is calling requesting a 2 month supply on her medication diclofenac (VOLTAREN) 50 MG EC tablet she will be going out of town and will not have enough until she gets back, please advise?

## 2015-01-25 NOTE — Telephone Encounter (Signed)
MED SENT TO PHARMACY, CALLED PATIENT, NO ANSWER

## 2015-05-10 ENCOUNTER — Other Ambulatory Visit (HOSPITAL_COMMUNITY): Payer: Self-pay | Admitting: Pulmonary Disease

## 2015-05-10 DIAGNOSIS — M542 Cervicalgia: Secondary | ICD-10-CM

## 2015-05-24 ENCOUNTER — Ambulatory Visit (HOSPITAL_COMMUNITY)
Admission: RE | Admit: 2015-05-24 | Discharge: 2015-05-24 | Disposition: A | Discharge status: Home / Self Care | Payer: BC Managed Care – PPO | Source: Ambulatory Visit | Attending: Pulmonary Disease | Admitting: Pulmonary Disease

## 2015-05-24 DIAGNOSIS — M4602 Spinal enthesopathy, cervical region: Secondary | ICD-10-CM | POA: Insufficient documentation

## 2015-05-24 DIAGNOSIS — M25512 Pain in left shoulder: Secondary | ICD-10-CM | POA: Diagnosis not present

## 2015-05-24 DIAGNOSIS — M50323 Other cervical disc degeneration at C6-C7 level: Secondary | ICD-10-CM | POA: Insufficient documentation

## 2015-05-24 DIAGNOSIS — M542 Cervicalgia: Secondary | ICD-10-CM

## 2015-09-22 ENCOUNTER — Other Ambulatory Visit: Payer: Self-pay | Admitting: Orthopedic Surgery

## 2015-09-24 NOTE — Telephone Encounter (Signed)
Patient called to check on status of refill request per [facility] - shows in [facility] Epic chart as electronic refill request; states she will be out as of Sunday, 09/26/15. Patient's ph# is [phone].

## 2015-09-24 NOTE — Telephone Encounter (Signed)
Not seen in office since 2013, should patient have follow up before refilling?

## 2015-09-27 NOTE — Telephone Encounter (Signed)
Please see note.

## 2015-10-26 ENCOUNTER — Other Ambulatory Visit: Payer: Self-pay | Admitting: Orthopedic Surgery

## 2015-10-29 NOTE — Telephone Encounter (Signed)
Patient called requesting refill of Diclofenac 50 mg EC Tab  Qty 60 Tablets   1 by mouth twice daily. This appears to have been sent in electronically also.

## 2015-11-01 ENCOUNTER — Other Ambulatory Visit: Payer: Self-pay | Admitting: *Deleted

## 2015-11-01 MED ORDER — DICLOFENAC SODIUM 50 MG PO TBEC: 50.0000 mg | DELAYED_RELEASE_TABLET | Freq: Two times a day (BID) | ORAL | Status: DC

## 2015-12-28 ENCOUNTER — Other Ambulatory Visit: Payer: Self-pay | Admitting: Obstetrics & Gynecology

## 2015-12-28 DIAGNOSIS — Z1231 Encounter for screening mammogram for malignant neoplasm of breast: Secondary | ICD-10-CM

## 2016-01-03 ENCOUNTER — Ambulatory Visit (HOSPITAL_COMMUNITY)
Admission: RE | Admit: 2016-01-03 | Discharge: 2016-01-03 | Disposition: A | Discharge status: Home / Self Care | Payer: BC Managed Care – PPO | Source: Ambulatory Visit | Attending: Obstetrics & Gynecology | Admitting: Obstetrics & Gynecology

## 2016-01-03 DIAGNOSIS — Z1231 Encounter for screening mammogram for malignant neoplasm of breast: Secondary | ICD-10-CM | POA: Insufficient documentation

## 2016-03-28 ENCOUNTER — Telehealth: Payer: Self-pay | Admitting: Orthopedic Surgery

## 2016-03-28 NOTE — Telephone Encounter (Signed)
Patient requested a refill on Diclofenac 50 mgs.         Sig: Take 1 tablet (50 mg total) by mouth 2 (two) times daily.

## 2016-03-29 ENCOUNTER — Other Ambulatory Visit: Payer: Self-pay | Admitting: *Deleted

## 2016-03-29 MED ORDER — DICLOFENAC SODIUM 50 MG PO TBEC: 50.0000 mg | DELAYED_RELEASE_TABLET | Freq: Two times a day (BID) | ORAL | 5 refills | Status: DC

## 2016-03-29 NOTE — Telephone Encounter (Signed)
Refill sent.

## 2016-03-29 NOTE — Telephone Encounter (Signed)
yes

## 2016-07-10 ENCOUNTER — Ambulatory Visit: Payer: BC Managed Care – PPO | Admitting: Orthopedic Surgery

## 2016-07-12 ENCOUNTER — Encounter (INDEPENDENT_AMBULATORY_CARE_PROVIDER_SITE_OTHER): Payer: Self-pay | Admitting: *Deleted

## 2016-07-13 ENCOUNTER — Encounter (INDEPENDENT_AMBULATORY_CARE_PROVIDER_SITE_OTHER): Payer: Self-pay | Admitting: *Deleted

## 2016-08-21 ENCOUNTER — Telehealth: Payer: Self-pay | Admitting: Orthopedic Surgery

## 2016-08-21 ENCOUNTER — Other Ambulatory Visit: Payer: Self-pay | Admitting: *Deleted

## 2016-08-21 MED ORDER — DICLOFENAC SODIUM 50 MG PO TBEC: 50.0000 mg | DELAYED_RELEASE_TABLET | Freq: Two times a day (BID) | ORAL | 5 refills | Status: DC

## 2016-08-21 NOTE — Telephone Encounter (Signed)
ok 

## 2016-08-21 NOTE — Telephone Encounter (Signed)
ROUTING TO DR HARRISON FOR APPROVAL 

## 2016-08-21 NOTE — Telephone Encounter (Signed)
Patient called asking for a 6 month/1 year supply for the medication listed below. She has not been seen by Dr. Romeo AppleHarrison since 2013. She stated that [place] is moving to [place] and wanted the medication until she can get set up with a doctor down there.  Diclofenac (Voltaren) 50 mg EC  Qty  60 Tablets  Take 1 tablet (50 mg total) by mouth 2 (two) times daily.

## 2016-08-21 NOTE — Telephone Encounter (Signed)
Medication sent in as requested.

## 2017-04-02 ENCOUNTER — Other Ambulatory Visit: Payer: Self-pay | Admitting: Orthopedic Surgery

## 2017-06-03 IMAGING — MR MR CERVICAL SPINE W/O CM
4 of 5 series · 14 of 48 positions shown · non-contrast
Comparison: Cervical MRI 01/20/2008

CLINICAL DATA: Neck pain with left shoulder pain

EXAM:
MRI CERVICAL SPINE WITHOUT CONTRAST
TECHNIQUE: Multiplanar, multisequence MR imaging of the cervical spine was
performed. No intravenous contrast was administered.

[Series 3: T2 · sagittal · 3.0mm · 0.41mm/px · 5 of 13 slices shown (1 of 2)]
[im 1/13]
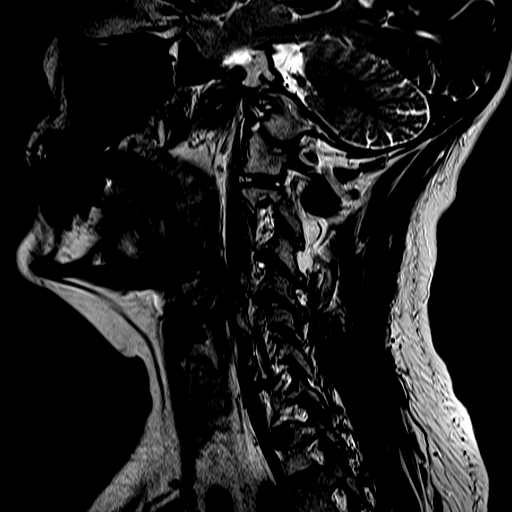
[im 3/13]
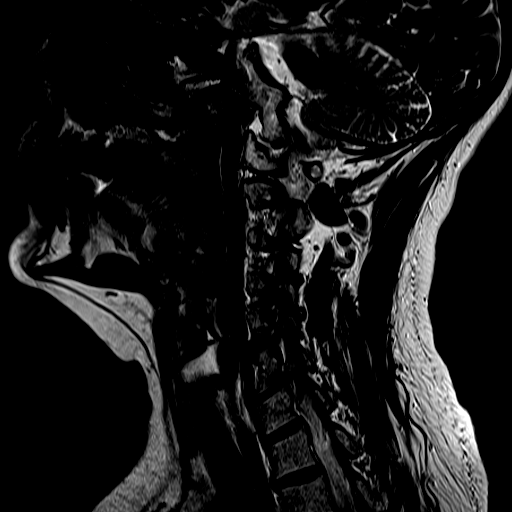
[im 5/13]
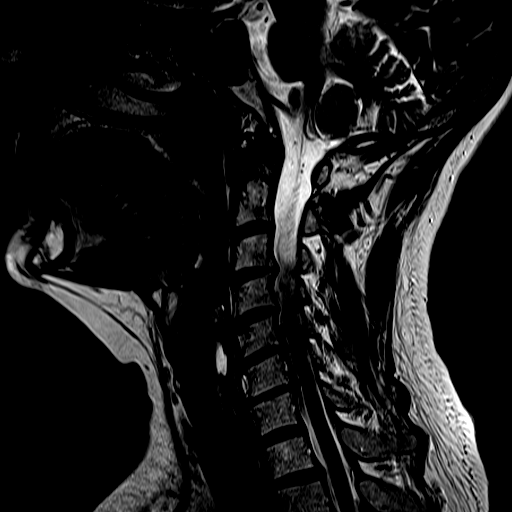
[im 8/13]
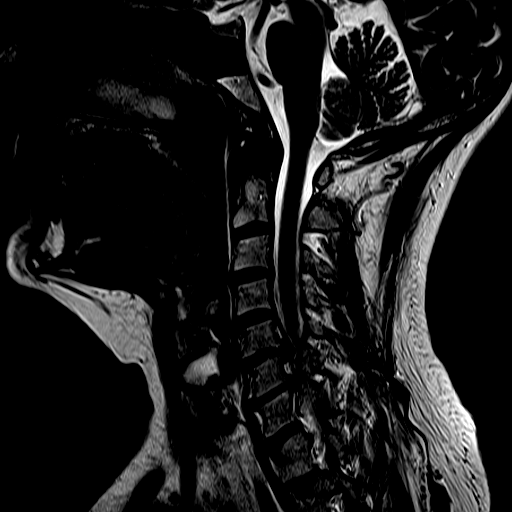
[im 13/13]
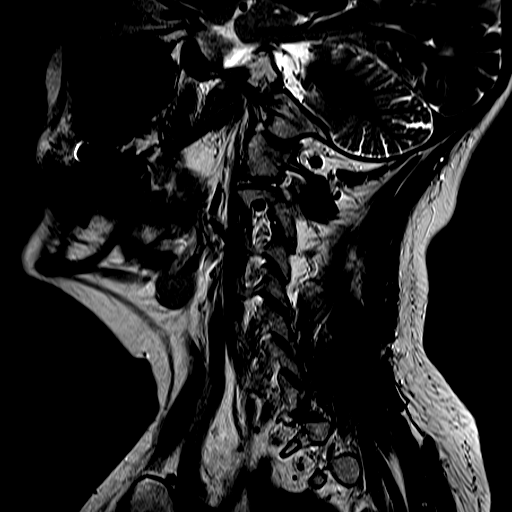

[Series 4: FLAIR · sagittal · 3.0mm · 0.47mm/px · 3 of 13 slices shown]
[im 1/13]
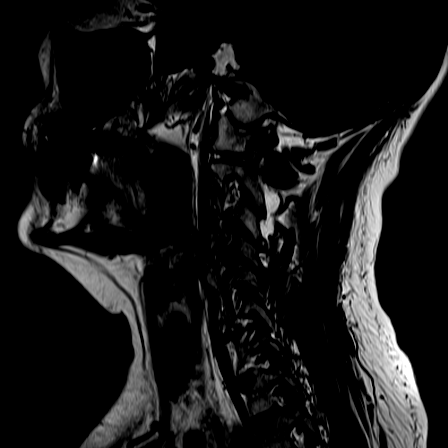
[im 7/13]
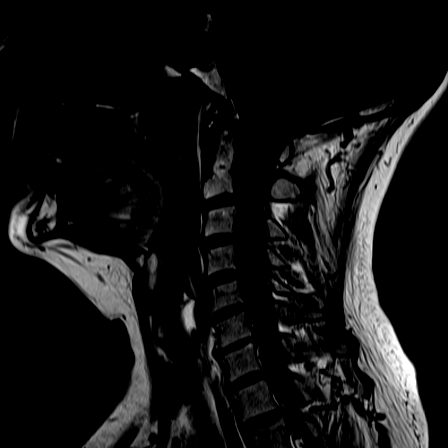
[im 13/13]
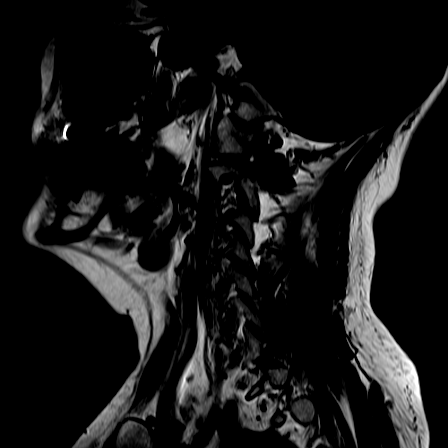

[Series 5: ir sagital · sagittal · 3.0mm · 0.24mm/px · 3 of 13 slices shown]
[im 1/13]
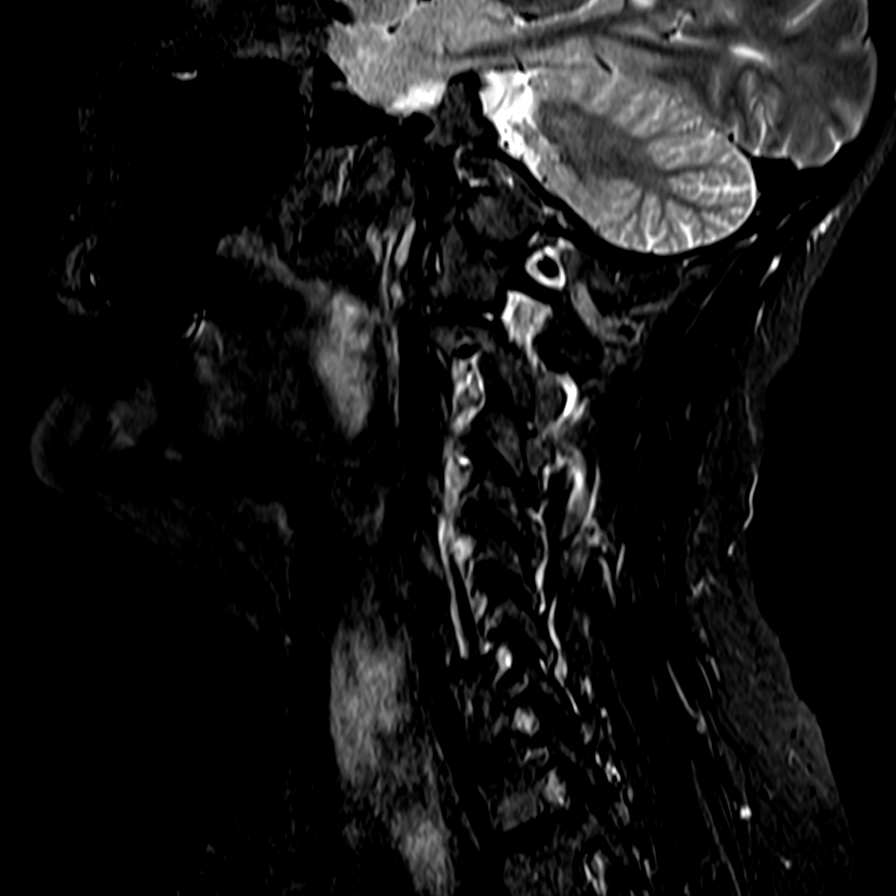
[im 7/13]
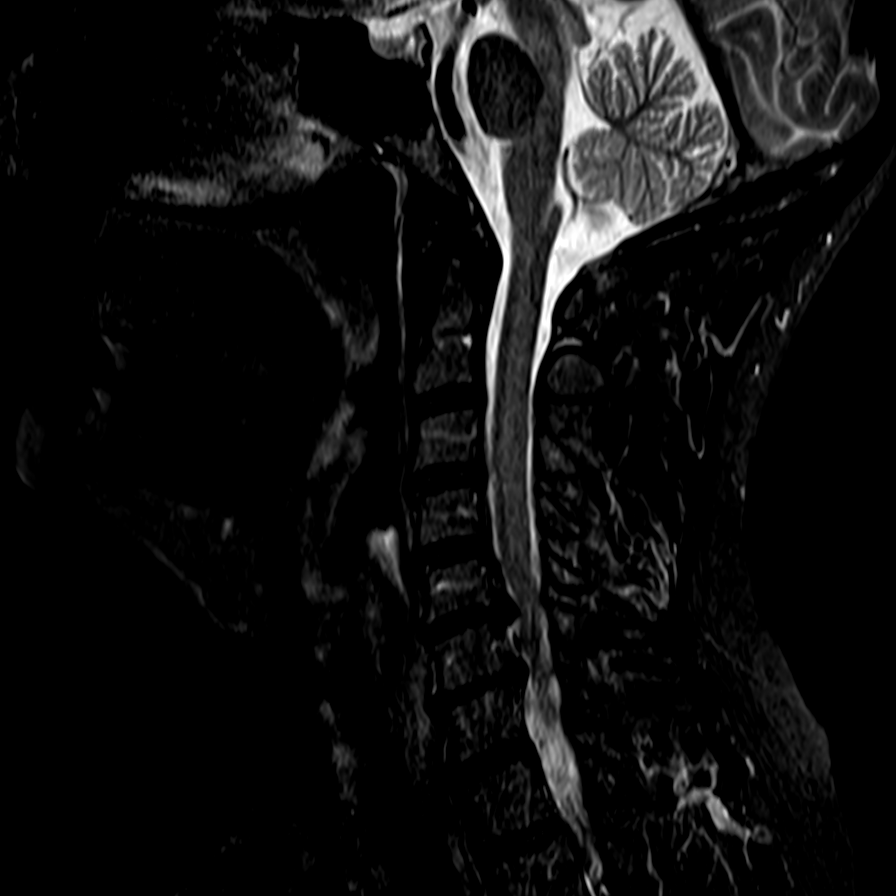
[im 13/13]
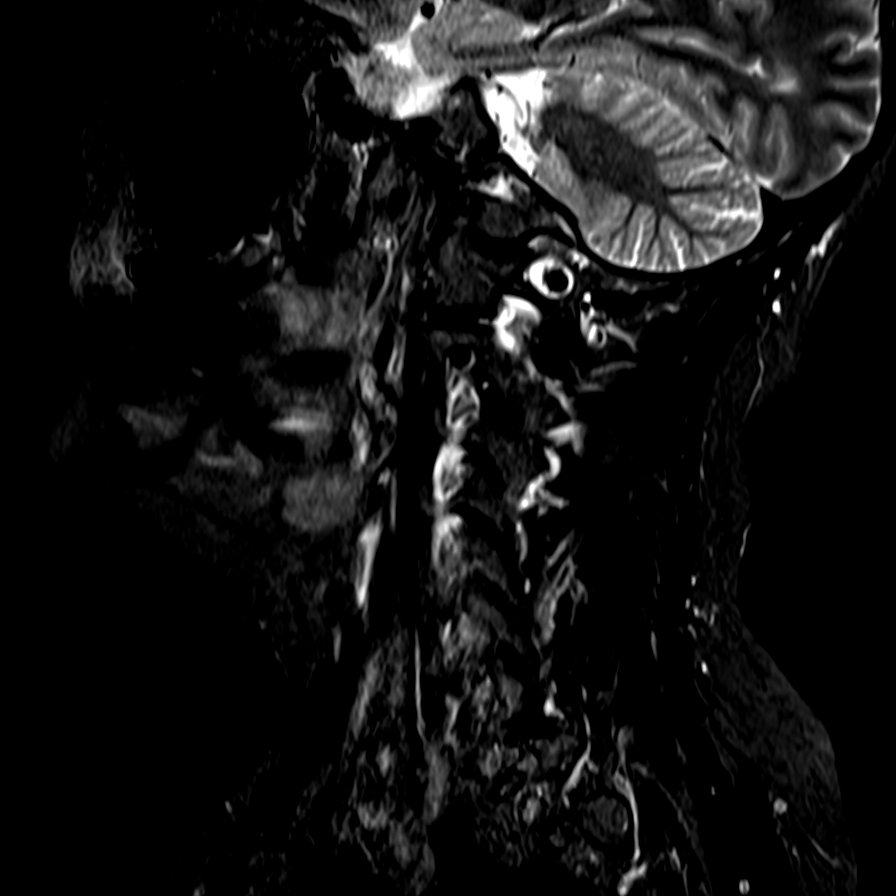

[Series 7: T2 · axial · 3.0mm · 0.22mm/px · z∈[-119,-31]mm · 3 of 38 slices shown (2 of 2)]
[im 5/38]
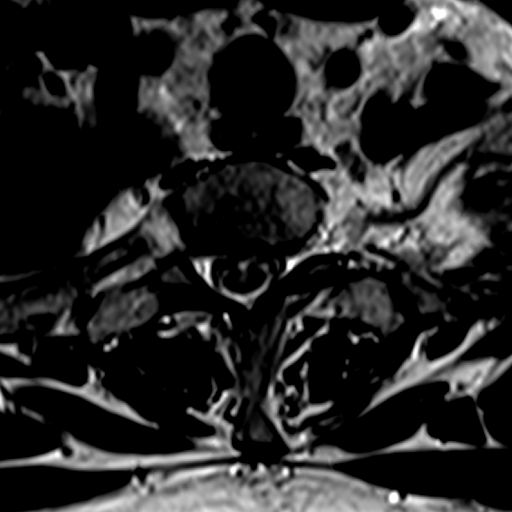
[im 20/38]
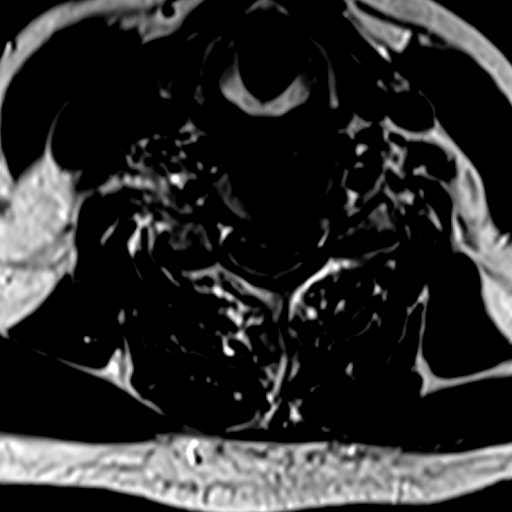
[im 33/38]
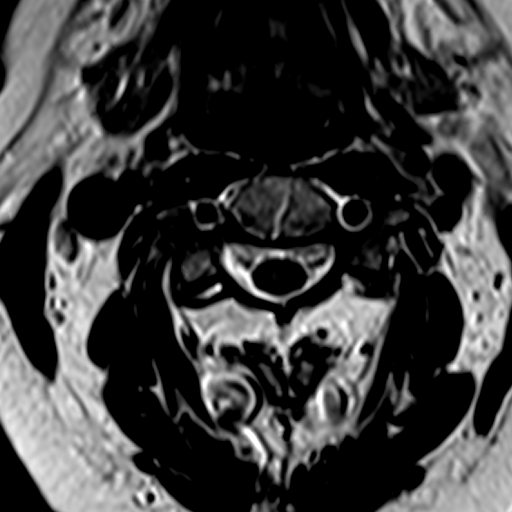

[14 of 48 positions shown; findings below may reference images not displayed]

FINDINGS: Normal alignment. Negative for fracture or mass. Spinal cord signal
is normal. Cervical medullary junction is normal.

C2-3:  Tiny central disc protrusion without stenosis

C3-4: Disc degeneration and uncinate spurring right greater than
left. Bilateral facet hypertrophy. Right foraminal narrowing. No
significant spinal stenosis.

C4-5: Right-sided uncinate spurring and right-sided facet
hypertrophy. Moderate right foraminal narrowing. No significant
spinal stenosis

C5-6: Large left-sided disc and osteophyte complex with significant
cord flattening on the left and severe left foraminal encroachment.
Bilateral facet hypertrophy. This shows mild progression from the
prior study however was present previously

C6-7: Disc degeneration with uncinate spurring left greater than
right. Improvement in left-sided disc protrusion since the prior
study. There remains significant spurring causing moderate spinal
stenosis. Moderate to severe left foraminal encroachment and mild
right foraminal encroachment

C7-T1:  Negative

T1-2: Small central disc protrusion unchanged.
IMPRESSION: Right foraminal narrowing C3-4 and C4-5 due to spurring

Large left-sided disc and osteophyte complex C5-6 with mild
progression since 6330. Severe left foraminal encroachment and
significant cord flattening on the left.

Disc degeneration and uncinate spurring at C6-7 left greater than
right. Moderate spinal stenosis and moderate to severe left
foraminal encroachment.

## 2017-10-01 ENCOUNTER — Other Ambulatory Visit: Payer: Self-pay | Admitting: Orthopedic Surgery

## 2018-04-01 ENCOUNTER — Other Ambulatory Visit: Payer: Self-pay | Admitting: Orthopedic Surgery

## 2021-08-01 ENCOUNTER — Other Ambulatory Visit: Payer: Self-pay | Admitting: Podiatry

## 2021-08-02 ENCOUNTER — Encounter: Payer: Self-pay | Admitting: Podiatry

## 2021-08-03 NOTE — Discharge Instructions (Signed)
[facility]  POST OPERATIVE INSTRUCTIONS FOR DR. FOWLER AND [facility]   Take your medication as prescribed.  Pain medication should be taken only as needed.  Keep the dressing clean, dry and intact.  Keep your foot elevated above the heart level for the first 48 hours.  Walking to the bathroom and brief periods of walking are acceptable, unless we have instructed you to be non-weight bearing.  Always wear your post-op shoe when walking.  Always use your crutches if you are to be non-weight bearing.  Do not take a shower. Baths are permissible as long as the foot is kept out of the water.   Every hour you are awake:  Bend your knee 15 times. Flex foot 15 times Massage calf 15 times  Call [facility] ([phone]) if any of the following problems occur: You develop a temperature or fever. The bandage becomes saturated with blood. Medication does not stop your pain. Injury of the foot occurs. Any symptoms of infection including redness, odor, or red streaks running from wound. 

## 2021-08-17 ENCOUNTER — Encounter
Admission: RE | Disposition: A | Discharge status: Home / Self Care | Payer: Self-pay | Source: Home / Self Care | Attending: Podiatry

## 2021-08-17 ENCOUNTER — Ambulatory Visit: Payer: Medicare PPO | Admitting: Anesthesiology

## 2021-08-17 ENCOUNTER — Encounter: Payer: Self-pay | Admitting: Podiatry

## 2021-08-17 ENCOUNTER — Other Ambulatory Visit: Payer: Self-pay

## 2021-08-17 ENCOUNTER — Ambulatory Visit
Admission: RE | Admit: 2021-08-17 | Discharge: 2021-08-17 | Disposition: A | Discharge status: Home / Self Care | Payer: Medicare PPO | Attending: Podiatry | Admitting: Podiatry

## 2021-08-17 ENCOUNTER — Ambulatory Visit: Payer: Self-pay

## 2021-08-17 DIAGNOSIS — M2012 Hallux valgus (acquired), left foot: Secondary | ICD-10-CM | POA: Diagnosis not present

## 2021-08-17 DIAGNOSIS — E039 Hypothyroidism, unspecified: Secondary | ICD-10-CM | POA: Insufficient documentation

## 2021-08-17 DIAGNOSIS — E663 Overweight: Secondary | ICD-10-CM | POA: Diagnosis not present

## 2021-08-17 DIAGNOSIS — Z6830 Body mass index (BMI) 30.0-30.9, adult: Secondary | ICD-10-CM | POA: Insufficient documentation

## 2021-08-17 HISTORY — DX: Motion sickness, initial encounter: T75.3XXA

## 2021-08-17 HISTORY — PX: BUNIONECTOMY: SHX129

## 2021-08-17 HISTORY — DX: Occipital neuralgia: M54.81

## 2021-08-17 HISTORY — DX: Presence of spectacles and contact lenses: Z97.3

## 2021-08-17 HISTORY — DX: Presence of dental prosthetic device (complete) (partial): Z97.2

## 2021-08-17 HISTORY — DX: Hypothyroidism, unspecified: E03.9

## 2021-08-17 SURGERY — BUNIONECTOMY
Anesthesia: General | Site: Toe | Laterality: Left

## 2021-08-17 MED ORDER — DEXAMETHASONE SODIUM PHOSPHATE 4 MG/ML IJ SOLN
INTRAMUSCULAR | Status: DC | PRN
  Administered 2021-08-17: 4 mg via INTRAVENOUS

## 2021-08-17 MED ORDER — OXYCODONE-ACETAMINOPHEN 5-325 MG PO TABS: 1.0000 | ORAL_TABLET | Freq: Four times a day (QID) | ORAL | 0 refills | Status: AC | PRN

## 2021-08-17 MED ORDER — LACTATED RINGERS IV SOLN: INTRAVENOUS | Status: DC

## 2021-08-17 MED ORDER — BUPIVACAINE LIPOSOME 1.3 % IJ SUSP
INTRAMUSCULAR | Status: DC | PRN
  Administered 2021-08-17 (×2): 10 mL

## 2021-08-17 MED ORDER — FENTANYL CITRATE (PF) 100 MCG/2ML IJ SOLN
INTRAMUSCULAR | Status: DC | PRN
  Administered 2021-08-17: 50 ug via INTRAVENOUS

## 2021-08-17 MED ORDER — PROPOFOL 10 MG/ML IV BOLUS
INTRAVENOUS | Status: DC | PRN
Start: 2021-08-17 — End: 2021-08-17
  Administered 2021-08-17: 150 mg via INTRAVENOUS

## 2021-08-17 MED ORDER — LIDOCAINE HCL (CARDIAC) PF 100 MG/5ML IV SOSY
PREFILLED_SYRINGE | INTRAVENOUS | Status: DC | PRN
  Administered 2021-08-17: 50 mg via INTRATRACHEAL

## 2021-08-17 MED ORDER — EPHEDRINE SULFATE 50 MG/ML IJ SOLN
INTRAMUSCULAR | Status: DC | PRN
  Administered 2021-08-17: 10 mg via INTRAVENOUS
  Administered 2021-08-17: 5 mg via INTRAVENOUS
  Administered 2021-08-17 (×2): 10 mg via INTRAVENOUS
  Administered 2021-08-17: 5 mg via INTRAVENOUS

## 2021-08-17 MED ORDER — SODIUM CHLORIDE 0.9 % IR SOLN
Status: DC | PRN
  Administered 2021-08-17: 500 mL

## 2021-08-17 MED ORDER — CEFAZOLIN SODIUM-DEXTROSE 2-4 GM/100ML-% IV SOLN
2.0000 g | INTRAVENOUS | Status: AC
  Administered 2021-08-17: 2 g via INTRAVENOUS

## 2021-08-17 MED ORDER — BUPIVACAINE HCL (PF) 0.25 % IJ SOLN
INTRAMUSCULAR | Status: DC | PRN
  Administered 2021-08-17: 10 mL

## 2021-08-17 MED ORDER — GLYCOPYRROLATE 0.2 MG/ML IJ SOLN
INTRAMUSCULAR | Status: DC | PRN
  Administered 2021-08-17: .1 mg via INTRAVENOUS

## 2021-08-17 MED ORDER — MIDAZOLAM HCL 5 MG/5ML IJ SOLN
INTRAMUSCULAR | Status: DC | PRN
  Administered 2021-08-17: 1 mg via INTRAVENOUS

## 2021-08-17 SURGICAL SUPPLY — 43 items
APL SKNCLS STERI-STRIP NONHPOA (GAUZE/BANDAGES/DRESSINGS) ×1
BENZOIN TINCTURE PRP APPL 2/3 (GAUZE/BANDAGES/DRESSINGS) ×3 IMPLANT
BLADE SAW LAPIPLASTY 40X11 (BLADE) ×1 IMPLANT
BNDG CMPR 75X41 PLY HI ABS (GAUZE/BANDAGES/DRESSINGS) ×1
BNDG COHESIVE 4X5 TAN ST LF (GAUZE/BANDAGES/DRESSINGS) ×3 IMPLANT
BNDG ELASTIC 4X5.8 VLCR STR LF (GAUZE/BANDAGES/DRESSINGS) ×3 IMPLANT
BNDG ESMARK 4X12 TAN STRL LF (GAUZE/BANDAGES/DRESSINGS) ×3 IMPLANT
BNDG GAUZE ELAST 4 BULKY (GAUZE/BANDAGES/DRESSINGS) ×3 IMPLANT
BNDG STRETCH 4X75 STRL LF (GAUZE/BANDAGES/DRESSINGS) ×3 IMPLANT
BOOT STEPPER DURA MED (SOFTGOODS) ×1 IMPLANT
CANISTER SUCT 1200ML W/VALVE (MISCELLANEOUS) ×3 IMPLANT
COVER LIGHT HANDLE UNIVERSAL (MISCELLANEOUS) ×6 IMPLANT
CUFF TOURN SGL QUICK 18X4 (TOURNIQUET CUFF) ×1 IMPLANT
DRAPE FLUOR MINI C-ARM 54X84 (DRAPES) ×3 IMPLANT
DURAPREP 26ML APPLICATOR (WOUND CARE) ×3 IMPLANT
ELECT REM PT RETURN 9FT ADLT (ELECTROSURGICAL) ×2
ELECTRODE REM PT RTRN 9FT ADLT (ELECTROSURGICAL) ×2 IMPLANT
GAUZE SPONGE 4X4 12PLY STRL (GAUZE/BANDAGES/DRESSINGS) ×3 IMPLANT
GAUZE XEROFORM 1X8 LF (GAUZE/BANDAGES/DRESSINGS) ×3 IMPLANT
GLOVE SRG 8 PF TXTR STRL LF DI (GLOVE) ×4 IMPLANT
GLOVE SURG ENC MOIS LTX SZ7.5 (GLOVE) ×6 IMPLANT
GLOVE SURG UNDER POLY LF SZ8 (GLOVE) ×4
GOWN STRL REUS W/ TWL LRG LVL3 (GOWN DISPOSABLE) ×4 IMPLANT
GOWN STRL REUS W/TWL LRG LVL3 (GOWN DISPOSABLE) ×4
KIT TURNOVER KIT A (KITS) ×3 IMPLANT
LAPIPLASTY SYS 4A (Orthopedic Implant) ×2 IMPLANT
NS IRRIG 500ML POUR BTL (IV SOLUTION) ×3 IMPLANT
PACK EXTREMITY ARMC (MISCELLANEOUS) ×3 IMPLANT
PENCIL SMOKE EVACUATOR (MISCELLANEOUS) ×1 IMPLANT
RASP SM TEAR CROSS CUT (RASP) ×1 IMPLANT
SCREW 2.7 HIGH PITCH LOCKING (Screw) ×1 IMPLANT
SCREW HIGH PITCH LOCK 2.7 (Screw) ×1 IMPLANT
STOCKINETTE IMPERVIOUS LG (DRAPES) ×3 IMPLANT
STRIP CLOSURE SKIN 1/4X4 (GAUZE/BANDAGES/DRESSINGS) ×3 IMPLANT
SUT ETHILON 3-0 FS-10 30 BLK (SUTURE) ×2
SUT MNCRL 4-0 (SUTURE) ×2
SUT MNCRL 4-0 27XMFL (SUTURE) ×1
SUT VIC AB 3-0 SH 27 (SUTURE) ×2
SUT VIC AB 3-0 SH 27X BRD (SUTURE) IMPLANT
SUT VIC AB 4-0 FS2 27 (SUTURE) ×1 IMPLANT
SUTURE EHLN 3-0 FS-10 30 BLK (SUTURE) IMPLANT
SUTURE MNCRL 4-0 27XMF (SUTURE) IMPLANT
SYSTEM LAPIPLASTY 4A (Orthopedic Implant) IMPLANT

## 2021-08-17 NOTE — H&P (Signed)
HISTORY AND PHYSICAL INTERVAL NOTE:  08/17/2021  1:59 PM  Kelsey Contreras  has presented today for surgery, with the diagnosis of M20.12 - Hallux valgus of left foot.  The various methods of treatment have been discussed with the patient.  No guarantees were given.  After consideration of risks, benefits and other options for treatment, the patient has consented to surgery.  I have reviewed the patients chart and labs.     A history and physical examination was performed in my office.  The patient was reexamined.  There have been no changes to this history and physical examination.  Samara Deist A

## 2021-08-17 NOTE — Anesthesia Procedure Notes (Signed)
Procedure Name: LMA Insertion Date/Time: 08/17/2021 2:15 PM Performed by: Jimmy Picket, CRNA Pre-anesthesia Checklist: Patient identified, Emergency Drugs available, Suction available, Timeout performed and Patient being monitored Patient Re-evaluated:Patient Re-evaluated prior to induction Oxygen Delivery Method: Circle system utilized Preoxygenation: Pre-oxygenation with 100% oxygen Induction Type: IV induction LMA: LMA inserted LMA Size: 4.0 Number of attempts: 1 Placement Confirmation: positive ETCO2 and breath sounds checked- equal and bilateral Tube secured with: Tape

## 2021-08-17 NOTE — Transfer of Care (Signed)
Immediate Anesthesia Transfer of Care Note  Patient: Kelsey Contreras  Procedure(s) Performed: Arbutus Leas LAPIDUS-TYPE (Left: Toe)  Patient Location: PACU  Anesthesia Type: General LMA  Level of Consciousness: awake, alert  and patient cooperative  Airway and Oxygen Therapy: Patient Spontanous Breathing and Patient connected to supplemental oxygen  Post-op Assessment: Post-op Vital signs reviewed, Patient's Cardiovascular Status Stable, Respiratory Function Stable, Patent Airway and No signs of Nausea or vomiting  Post-op Vital Signs: Reviewed and stable  Complications: No notable events documented.

## 2021-08-17 NOTE — Anesthesia Preprocedure Evaluation (Signed)
Anesthesia Evaluation  Patient identified by MRN, date of birth, ID band Patient awake    Reviewed: Allergy & Precautions, H&P , NPO status , Patient's Chart, lab work & pertinent test results  Airway Mallampati: II  TM Distance: >3 FB Neck ROM: full    Dental no notable dental hx. (+) Partial Upper   Pulmonary    Pulmonary exam normal breath sounds clear to auscultation       Cardiovascular Normal cardiovascular exam Rhythm:regular Rate:Normal     Neuro/Psych    GI/Hepatic   Endo/Other  Hypothyroidism Over weight  Renal/GU      Musculoskeletal   Abdominal   Peds  Hematology   Anesthesia Other Findings   Reproductive/Obstetrics                             Anesthesia Physical Anesthesia Plan  ASA: 2  Anesthesia Plan: General LMA   Post-op Pain Management: Minimal or no pain anticipated   Induction:   PONV Risk Score and Plan: 3 and Treatment may vary due to age or medical condition, Ondansetron, Dexamethasone and Scopolamine patch - Pre-op  Airway Management Planned:   Additional Equipment:   Intra-op Plan:   Post-operative Plan:   Informed Consent: I have reviewed the patients History and Physical, chart, labs and discussed the procedure including the risks, benefits and alternatives for the proposed anesthesia with the patient or authorized representative who has indicated his/her understanding and acceptance.     Dental Advisory Given  Plan Discussed with: CRNA  Anesthesia Plan Comments:         Anesthesia Quick Evaluation

## 2021-08-17 NOTE — Anesthesia Postprocedure Evaluation (Signed)
Anesthesia Post Note  Patient: Kelsey Contreras  Procedure(s) Performed: Arbutus Leas LAPIDUS-TYPE (Left: Toe)     Patient location during evaluation: PACU Anesthesia Type: General Level of consciousness: awake and alert and oriented Pain management: satisfactory to patient Vital Signs Assessment: post-procedure vital signs reviewed and stable Respiratory status: spontaneous breathing, nonlabored ventilation and respiratory function stable Cardiovascular status: blood pressure returned to baseline and stable Postop Assessment: Adequate PO intake and No signs of nausea or vomiting Anesthetic complications: no   No notable events documented.  Cherly Beach

## 2021-08-17 NOTE — Op Note (Signed)
Operative note   Surgeon:Deshonda Cryderman Lawyer: None    Preop diagnosis: Hallux valgus deformity left foot    Postop diagnosis: Same    Procedure: Lapidus hallux valgus correction left foot    EBL: Minimal    Anesthesia:local and general.  Local consisted of a total of 10 cc of 0.25% bupivacaine and 20 cc of Exparel long-acting anesthetic    Hemostasis: Ankle tourniquet inflated to 200 mmHg for 70 minutes    Specimen: None    Complications: None    Operative indications:Kelsey Contreras is an 66 y.o. that presents today for surgical intervention.  The risks/benefits/alternatives/complications have been discussed and consent has been given.    Procedure:  Patient was brought into the OR and placed on the operating table in thesupine position. After anesthesia was obtained theleft lower extremity was prepped and draped in usual sterile fashion.  Attention was directed to the dorsal aspect of the foot where a dorsal incision was made at the first met cuneiforms joint.  Sharp and blunt dissection was carried down to the periosteum.  Subperiosteal dissection was then undertaken.  This exposed the first met cuneiform joint.  This was then freed and loosened.  A small fulcrum was placed between the base of the first metatarsal and second metatarsal.  Next the joint positioner was placed on the medial aspect of the metatarsal.  A small stab incision was made at the second metatarsal.  Compression was placed for the positioner.  Good realignment of the first intermetatarsal angle was noted at this time.  Attention was then directed to the dorsomedial first MTPJ where an incision was performed.  Sharp and blunt dissection was carried down to the capsule.  The intermetatarsal space was then entered.  The conjoined tendon of the abductor was then freed from the base of the proximal phalanx.  Attention was to redirected to the first met cuneiform joint.  At this time the osteotomy cut guide was  placed into the joint region.  2 vertical cuts were then made.  The cartilage material was removed from the first met cuneiform joint and the joint was then prepped with a 2.0 mm drill bit.  The joint compressor was then placed.  Good compression and realignment was noted.  Next the medial and dorsal locking plates were then placed from the Custar set.  Realignment and stability was noted in all planes.  Attention was redirected to the dorsomedial first MTPJ.  A small T capsulotomy was performed.  The dorsomedial eminence was noted and transected and smoothed with a power rasp.  A small capsulorrhaphy was performed.  Closure was then performed after all areas were irrigated.  3-0 Vicryl for the capsular tissue.  4-0 Vicryl in subcutaneous tissue and a 4-0 Monocryl for the skin.   Further local anesthesia was performed at the end of the case.    Patient tolerated the procedure and anesthesia well.  Was transported from the OR to the PACU with all vital signs stable and vascular status intact. To be discharged per routine protocol.  Will follow up in approximately 1 week in the outpatient clinic.

## 2021-08-18 ENCOUNTER — Encounter: Payer: Self-pay | Admitting: Podiatry
